# Patient Record
Sex: Male | Born: 1935 | Race: White | Hispanic: No | Marital: Married | Smoking: Former smoker
Health system: Southern US, Community
[De-identification: ages and names within clinical notes are randomized; demographics above are authoritative.]

## PROBLEM LIST (undated history)

## (undated) DIAGNOSIS — M5020 Other cervical disc displacement, unspecified cervical region: Secondary | ICD-10-CM

## (undated) DIAGNOSIS — J302 Other seasonal allergic rhinitis: Secondary | ICD-10-CM

## (undated) DIAGNOSIS — E119 Type 2 diabetes mellitus without complications: Secondary | ICD-10-CM

## (undated) DIAGNOSIS — R160 Hepatomegaly, not elsewhere classified: Secondary | ICD-10-CM

## (undated) DIAGNOSIS — I10 Essential (primary) hypertension: Secondary | ICD-10-CM

## (undated) DIAGNOSIS — N200 Calculus of kidney: Secondary | ICD-10-CM

## (undated) DIAGNOSIS — M1990 Unspecified osteoarthritis, unspecified site: Secondary | ICD-10-CM

## (undated) DIAGNOSIS — R52 Pain, unspecified: Secondary | ICD-10-CM

## (undated) DIAGNOSIS — C801 Malignant (primary) neoplasm, unspecified: Secondary | ICD-10-CM

## (undated) DIAGNOSIS — Z9889 Other specified postprocedural states: Secondary | ICD-10-CM

## (undated) DIAGNOSIS — C61 Malignant neoplasm of prostate: Principal | ICD-10-CM

## (undated) HISTORY — PX: OTHER SURGICAL HISTORY: SHX169

## (undated) HISTORY — DX: Essential (primary) hypertension: I10

## (undated) HISTORY — PX: APPENDECTOMY: SHX54

## (undated) HISTORY — DX: Calculus of kidney: N20.0

## (undated) HISTORY — DX: Type 2 diabetes mellitus without complications: E11.9

## (undated) HISTORY — DX: Malignant neoplasm of prostate: C61

## (undated) HISTORY — DX: Other cervical disc displacement, unspecified cervical region: M50.20

## (undated) HISTORY — DX: Other specified postprocedural states: Z98.890

## (undated) HISTORY — DX: Hepatomegaly, not elsewhere classified: R16.0

---

## 1997-12-24 ENCOUNTER — Ambulatory Visit (HOSPITAL_COMMUNITY): Admission: RE | Admit: 1997-12-24 | Discharge: 1997-12-24 | Payer: Self-pay | Admitting: Internal Medicine

## 1998-03-27 ENCOUNTER — Other Ambulatory Visit: Admission: RE | Admit: 1998-03-27 | Discharge: 1998-03-27 | Payer: Self-pay | Admitting: Urology

## 1999-01-12 ENCOUNTER — Other Ambulatory Visit: Admission: RE | Admit: 1999-01-12 | Discharge: 1999-01-12 | Payer: Self-pay | Admitting: Urology

## 1999-02-04 ENCOUNTER — Ambulatory Visit (HOSPITAL_COMMUNITY): Admission: RE | Admit: 1999-02-04 | Discharge: 1999-02-04 | Payer: Self-pay | Admitting: Radiation Oncology

## 1999-03-22 ENCOUNTER — Ambulatory Visit (HOSPITAL_COMMUNITY): Admission: RE | Admit: 1999-03-22 | Discharge: 1999-03-22 | Payer: Self-pay | Admitting: Radiation Oncology

## 1999-04-08 ENCOUNTER — Encounter: Admission: RE | Admit: 1999-04-08 | Discharge: 1999-07-07 | Payer: Self-pay | Admitting: Radiation Oncology

## 1999-04-23 ENCOUNTER — Ambulatory Visit (HOSPITAL_BASED_OUTPATIENT_CLINIC_OR_DEPARTMENT_OTHER): Admission: RE | Admit: 1999-04-23 | Discharge: 1999-04-23 | Payer: Self-pay | Admitting: Urology

## 1999-04-23 ENCOUNTER — Encounter: Payer: Self-pay | Admitting: Urology

## 1999-05-17 ENCOUNTER — Encounter: Admission: RE | Admit: 1999-05-17 | Discharge: 1999-08-15 | Payer: Self-pay | Admitting: Radiation Oncology

## 1999-11-23 ENCOUNTER — Encounter: Payer: Self-pay | Admitting: Internal Medicine

## 1999-11-23 ENCOUNTER — Ambulatory Visit (HOSPITAL_COMMUNITY): Admission: RE | Admit: 1999-11-23 | Discharge: 1999-11-23 | Payer: Self-pay | Admitting: Internal Medicine

## 2000-06-09 ENCOUNTER — Encounter: Payer: Self-pay | Admitting: Internal Medicine

## 2000-06-09 ENCOUNTER — Ambulatory Visit (HOSPITAL_COMMUNITY): Admission: RE | Admit: 2000-06-09 | Discharge: 2000-06-09 | Payer: Self-pay | Admitting: Internal Medicine

## 2000-12-25 ENCOUNTER — Ambulatory Visit (HOSPITAL_COMMUNITY): Admission: RE | Admit: 2000-12-25 | Discharge: 2000-12-25 | Payer: Self-pay | Admitting: Ophthalmology

## 2001-05-31 ENCOUNTER — Ambulatory Visit (HOSPITAL_COMMUNITY): Admission: RE | Admit: 2001-05-31 | Discharge: 2001-05-31 | Payer: Self-pay | Admitting: Internal Medicine

## 2001-05-31 ENCOUNTER — Encounter: Payer: Self-pay | Admitting: Internal Medicine

## 2001-06-20 HISTORY — PX: COLON SURGERY: SHX602

## 2001-11-23 ENCOUNTER — Inpatient Hospital Stay (HOSPITAL_COMMUNITY): Admission: EM | Admit: 2001-11-23 | Discharge: 2001-11-23 | Payer: Self-pay | Admitting: Emergency Medicine

## 2001-12-31 ENCOUNTER — Encounter (INDEPENDENT_AMBULATORY_CARE_PROVIDER_SITE_OTHER): Payer: Self-pay | Admitting: Specialist

## 2001-12-31 ENCOUNTER — Encounter: Payer: Self-pay | Admitting: Gastroenterology

## 2001-12-31 ENCOUNTER — Ambulatory Visit (HOSPITAL_COMMUNITY): Admission: RE | Admit: 2001-12-31 | Discharge: 2001-12-31 | Payer: Self-pay | Admitting: Gastroenterology

## 2002-01-22 ENCOUNTER — Encounter: Payer: Self-pay | Admitting: General Surgery

## 2002-01-29 ENCOUNTER — Inpatient Hospital Stay (HOSPITAL_COMMUNITY): Admission: RE | Admit: 2002-01-29 | Discharge: 2002-02-02 | Payer: Self-pay | Admitting: General Surgery

## 2002-01-29 ENCOUNTER — Encounter (INDEPENDENT_AMBULATORY_CARE_PROVIDER_SITE_OTHER): Payer: Self-pay | Admitting: Specialist

## 2002-03-20 ENCOUNTER — Inpatient Hospital Stay (HOSPITAL_COMMUNITY): Admission: EM | Admit: 2002-03-20 | Discharge: 2002-03-26 | Payer: Self-pay | Admitting: Emergency Medicine

## 2002-03-20 ENCOUNTER — Encounter: Payer: Self-pay | Admitting: *Deleted

## 2002-03-23 ENCOUNTER — Encounter: Payer: Self-pay | Admitting: Hematology & Oncology

## 2002-06-25 ENCOUNTER — Encounter: Payer: Self-pay | Admitting: *Deleted

## 2002-06-25 ENCOUNTER — Ambulatory Visit (HOSPITAL_COMMUNITY): Admission: RE | Admit: 2002-06-25 | Discharge: 2002-06-25 | Payer: Self-pay | Admitting: *Deleted

## 2002-12-18 ENCOUNTER — Ambulatory Visit (HOSPITAL_COMMUNITY): Admission: RE | Admit: 2002-12-18 | Discharge: 2002-12-18 | Payer: Self-pay | Admitting: Gastroenterology

## 2002-12-18 ENCOUNTER — Encounter (INDEPENDENT_AMBULATORY_CARE_PROVIDER_SITE_OTHER): Payer: Self-pay | Admitting: Specialist

## 2003-01-08 ENCOUNTER — Ambulatory Visit (HOSPITAL_COMMUNITY): Admission: RE | Admit: 2003-01-08 | Discharge: 2003-01-08 | Payer: Self-pay | Admitting: Oncology

## 2003-01-08 ENCOUNTER — Encounter: Payer: Self-pay | Admitting: Oncology

## 2003-02-21 ENCOUNTER — Encounter: Payer: Self-pay | Admitting: Internal Medicine

## 2003-02-21 ENCOUNTER — Encounter: Admission: RE | Admit: 2003-02-21 | Discharge: 2003-02-21 | Payer: Self-pay | Admitting: Internal Medicine

## 2003-02-23 ENCOUNTER — Encounter: Admission: RE | Admit: 2003-02-23 | Discharge: 2003-02-23 | Payer: Self-pay | Admitting: Internal Medicine

## 2003-02-23 ENCOUNTER — Encounter: Payer: Self-pay | Admitting: Internal Medicine

## 2004-07-13 ENCOUNTER — Ambulatory Visit: Payer: Self-pay | Admitting: Oncology

## 2004-07-29 ENCOUNTER — Ambulatory Visit (HOSPITAL_COMMUNITY): Admission: RE | Admit: 2004-07-29 | Discharge: 2004-07-29 | Payer: Self-pay | Admitting: Oncology

## 2005-01-10 ENCOUNTER — Ambulatory Visit: Payer: Self-pay | Admitting: Oncology

## 2005-01-24 ENCOUNTER — Ambulatory Visit (HOSPITAL_COMMUNITY): Admission: RE | Admit: 2005-01-24 | Discharge: 2005-01-24 | Payer: Self-pay | Admitting: Oncology

## 2005-07-11 ENCOUNTER — Ambulatory Visit: Payer: Self-pay | Admitting: Oncology

## 2006-01-03 ENCOUNTER — Ambulatory Visit: Payer: Self-pay | Admitting: Oncology

## 2006-01-06 LAB — CBC WITH DIFFERENTIAL/PLATELET
Eosinophils Absolute: 0.1 10*3/uL (ref 0.0–0.5)
MONO#: 0.6 10*3/uL (ref 0.1–0.9)
MONO%: 8.8 % (ref 0.0–13.0)
NEUT#: 3.5 10*3/uL (ref 1.5–6.5)
RBC: 4.31 10*6/uL (ref 4.20–5.71)
RDW: 12.8 % (ref 11.2–14.6)
WBC: 7.3 10*3/uL (ref 4.0–10.0)
lymph#: 3 10*3/uL (ref 0.9–3.3)

## 2006-01-06 LAB — COMPREHENSIVE METABOLIC PANEL
Albumin: 4.6 g/dL (ref 3.5–5.2)
Alkaline Phosphatase: 49 U/L (ref 39–117)
CO2: 23 mEq/L (ref 19–32)
Chloride: 105 mEq/L (ref 96–112)
Glucose, Bld: 105 mg/dL — ABNORMAL HIGH (ref 70–99)
Potassium: 4.1 mEq/L (ref 3.5–5.3)
Sodium: 142 mEq/L (ref 135–145)
Total Protein: 8.2 g/dL (ref 6.0–8.3)

## 2006-01-06 LAB — LACTATE DEHYDROGENASE: LDH: 162 U/L (ref 94–250)

## 2006-01-12 ENCOUNTER — Ambulatory Visit (HOSPITAL_COMMUNITY): Admission: RE | Admit: 2006-01-12 | Discharge: 2006-01-12 | Payer: Self-pay | Admitting: Oncology

## 2006-02-01 ENCOUNTER — Emergency Department (HOSPITAL_COMMUNITY): Admission: EM | Admit: 2006-02-01 | Discharge: 2006-02-01 | Payer: Self-pay | Admitting: Emergency Medicine

## 2006-07-12 ENCOUNTER — Ambulatory Visit: Payer: Self-pay | Admitting: Oncology

## 2006-07-17 LAB — PSA: PSA: 0.45 ng/mL (ref 0.10–4.00)

## 2006-07-17 LAB — COMPREHENSIVE METABOLIC PANEL
Alkaline Phosphatase: 50 U/L (ref 39–117)
BUN: 12 mg/dL (ref 6–23)
Creatinine, Ser: 0.85 mg/dL (ref 0.40–1.50)
Glucose, Bld: 184 mg/dL — ABNORMAL HIGH (ref 70–99)
Total Bilirubin: 0.5 mg/dL (ref 0.3–1.2)

## 2006-07-17 LAB — CBC WITH DIFFERENTIAL/PLATELET
Basophils Absolute: 0 10*3/uL (ref 0.0–0.1)
EOS%: 1.8 % (ref 0.0–7.0)
HGB: 13.7 g/dL (ref 13.0–17.1)
MCH: 32.2 pg (ref 28.0–33.4)
MCV: 92 fL (ref 81.6–98.0)
MONO%: 7.3 % (ref 0.0–13.0)
NEUT%: 51.8 % (ref 40.0–75.0)
RDW: 12.4 % (ref 11.2–14.6)

## 2006-07-19 ENCOUNTER — Ambulatory Visit (HOSPITAL_COMMUNITY): Admission: RE | Admit: 2006-07-19 | Discharge: 2006-07-19 | Payer: Self-pay | Admitting: Oncology

## 2007-01-11 ENCOUNTER — Ambulatory Visit: Payer: Self-pay | Admitting: Oncology

## 2007-01-15 LAB — CBC WITH DIFFERENTIAL/PLATELET
BASO%: 0.6 % (ref 0.0–2.0)
Basophils Absolute: 0 10*3/uL (ref 0.0–0.1)
HCT: 39.4 % (ref 38.7–49.9)
HGB: 13.8 g/dL (ref 13.0–17.1)
MONO#: 0.6 10*3/uL (ref 0.1–0.9)
NEUT%: 49.3 % (ref 40.0–75.0)
WBC: 7 10*3/uL (ref 4.0–10.0)
lymph#: 2.7 10*3/uL (ref 0.9–3.3)

## 2007-01-15 LAB — COMPREHENSIVE METABOLIC PANEL
ALT: 28 U/L (ref 0–53)
Albumin: 4.3 g/dL (ref 3.5–5.2)
BUN: 12 mg/dL (ref 6–23)
CO2: 24 mEq/L (ref 19–32)
Calcium: 9.3 mg/dL (ref 8.4–10.5)
Chloride: 105 mEq/L (ref 96–112)
Creatinine, Ser: 0.95 mg/dL (ref 0.40–1.50)
Potassium: 4.4 mEq/L (ref 3.5–5.3)

## 2007-01-15 LAB — LACTATE DEHYDROGENASE: LDH: 136 U/L (ref 94–250)

## 2007-01-15 LAB — CEA: CEA: <0.5 ng/mL (ref 0.0–5.0)

## 2007-01-17 ENCOUNTER — Ambulatory Visit (HOSPITAL_COMMUNITY): Admission: RE | Admit: 2007-01-17 | Discharge: 2007-01-17 | Payer: Self-pay | Admitting: Oncology

## 2007-07-18 IMAGING — CT CT ABDOMEN W/ CM
1 of 4 series · 14 of 32 positions shown, 19 images · IV contrast (omnipaque)
Comparison: 07/29/04.

CLINICAL DATA: Transverse colon cancer.
ABDOMEN CT WITH CONTRAST:
TECHNIQUE: Multidetector CT imaging of the abdomen was performed following the standard protocol during bolus administration of intravenous contrast.
Contrast:  125 cc Omnipaque 300
TECHNIQUE: Multidetector CT imaging of the pelvis was performed following the standard protocol during bolus administration of intravenous contrast.
No pelvic lymphadenopathy. Radiation seeds are seen within the prostate gland.  No free pelvic fluid.  The visualized pelvic bowel loops are negative.  Negative for inguinal lymphadenopathy.  Review of bone windows demonstrates no obvious lytic or sclerotic lesions.

[Series 2: abd_pel 5.0 b40f st · axial · 0.85mm/px · z∈[-509,-109]mm · 14 of 92 slices shown, 19 images]
[im 6/92  soft-tissue]
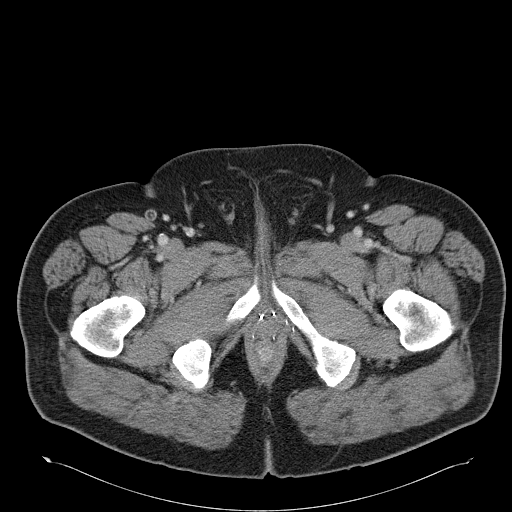
[im 6/92  bone]
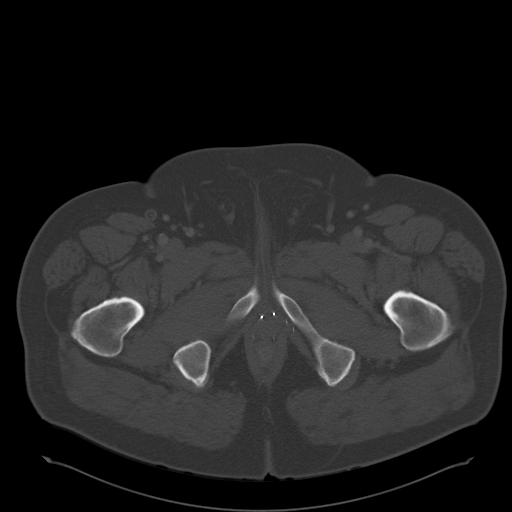
[im 12/92  soft-tissue]
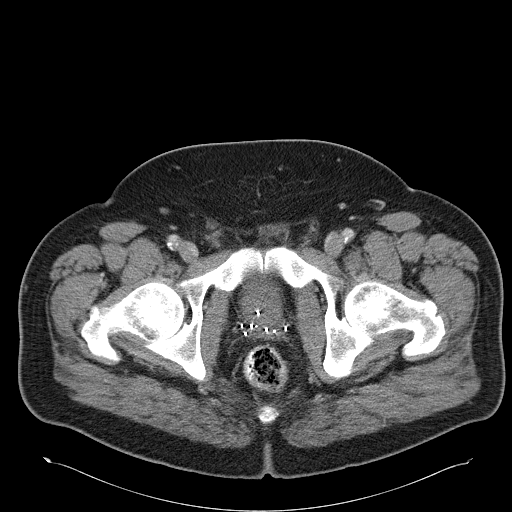
[im 18/92  soft-tissue]
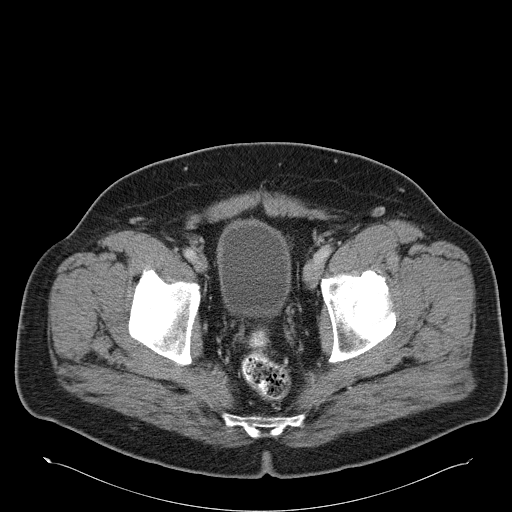
[im 29/92  soft-tissue]
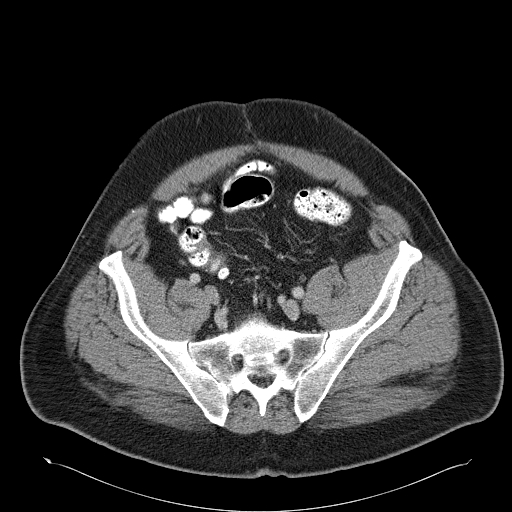
[im 35/92  soft-tissue]
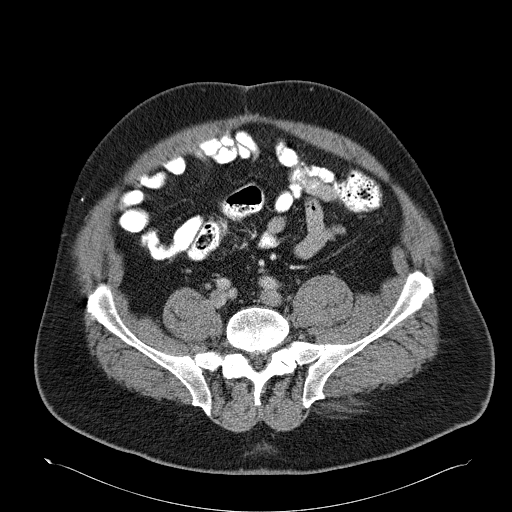
[im 40/92  soft-tissue]
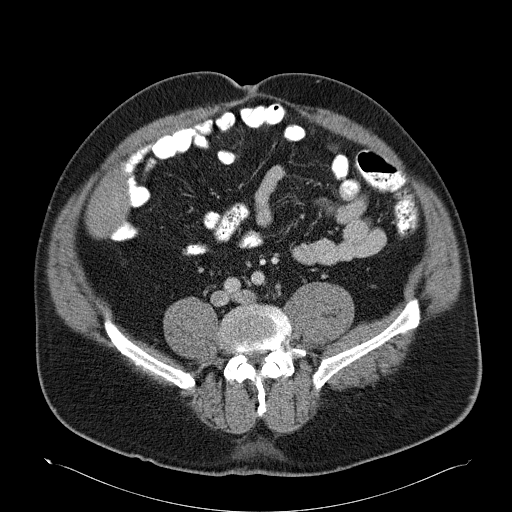
[im 46/92  soft-tissue]
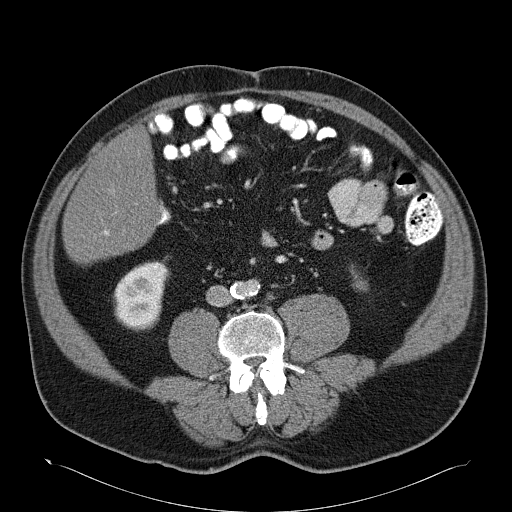
[im 52/92  soft-tissue]
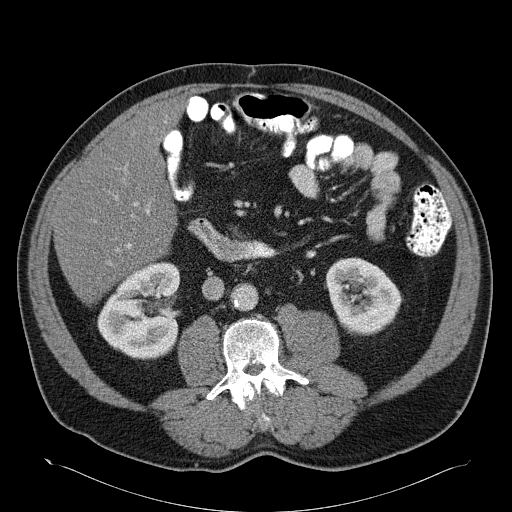
[im 57/92  soft-tissue]
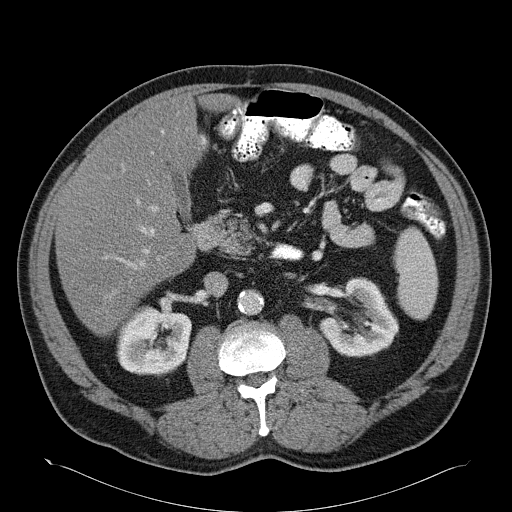
[im 57/92  bone]
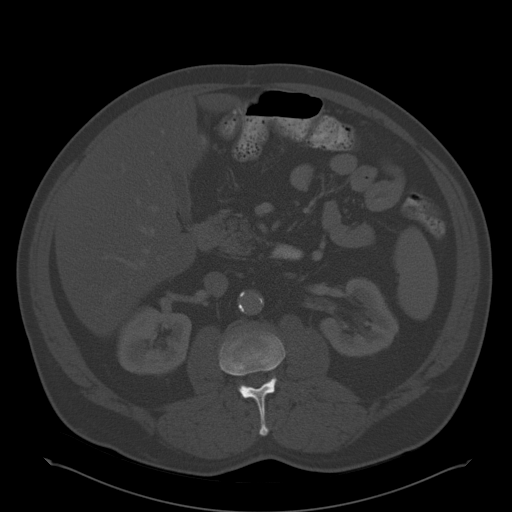
[im 63/92  soft-tissue]
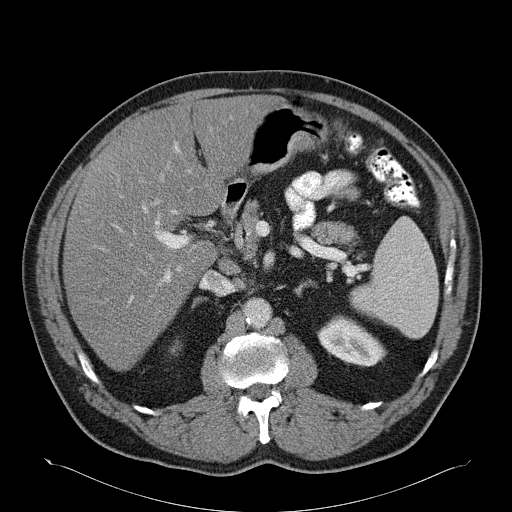
[im 69/92  lung]
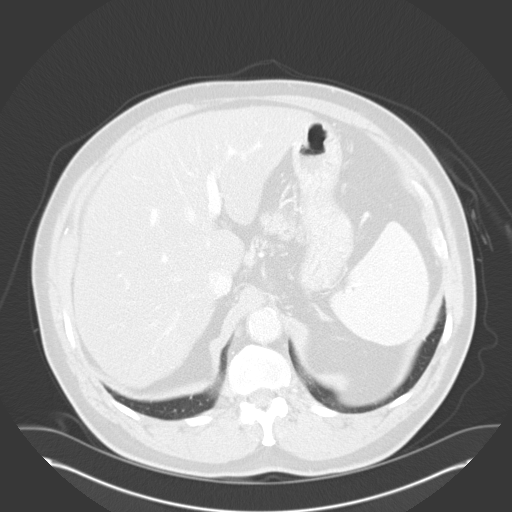
[im 74/92  soft-tissue]
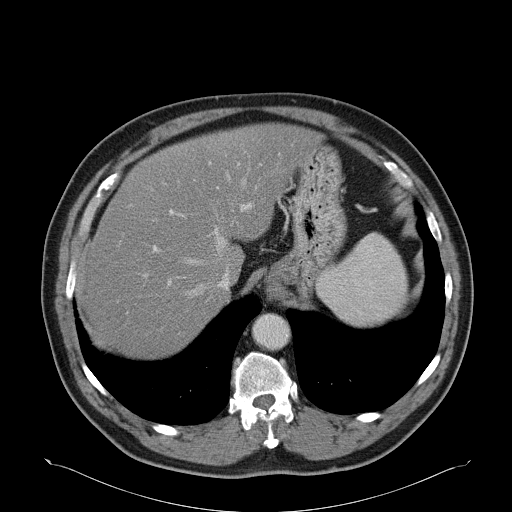
[im 74/92  lung]
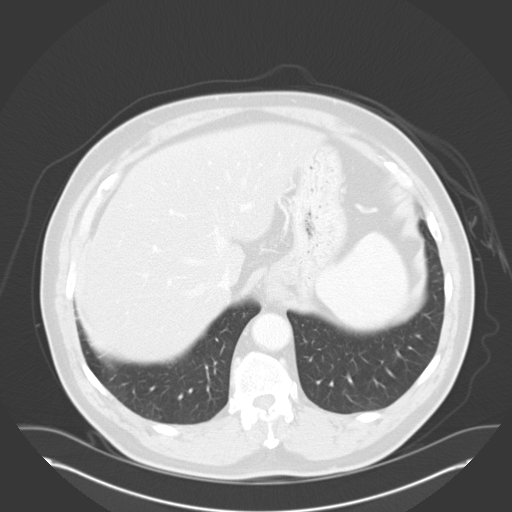
[im 80/92  soft-tissue]
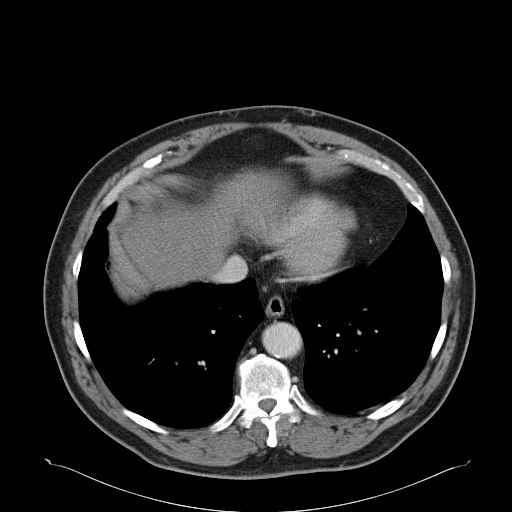
[im 80/92  lung]
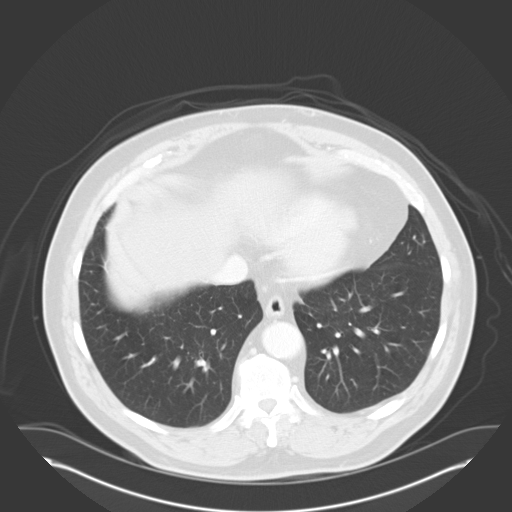
[im 86/92  soft-tissue]
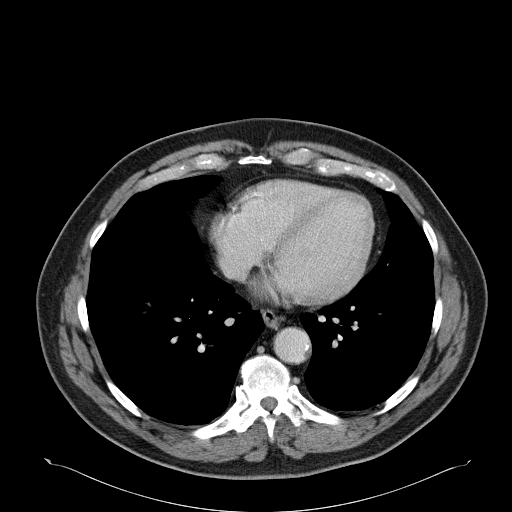
[im 86/92  lung]
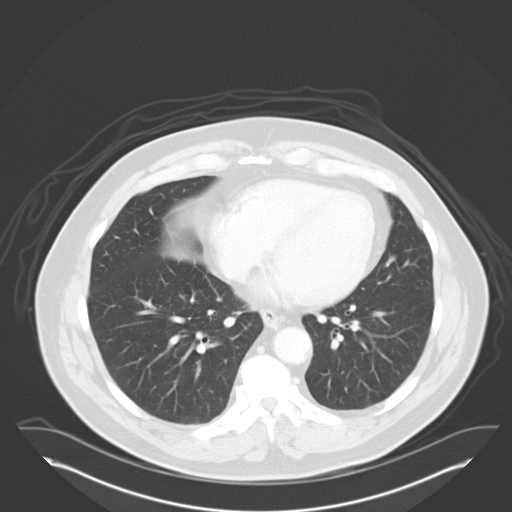

[14 of 32 positions shown; findings below may reference images not displayed]

FINDINGS: Subpleural nodule right lower lobe, image 11, is stable in the interval.  
There are no pleural effusions.
Fatty infiltration of the liver is again noted.  No focal liver lesions are identified.  
Pancreas is negative.
Right kidney negative.  Left kidney negative.
No pathologically enlarged retroperitoneal or pelvic lymphadenopathy.
Stable small porta hepatis and mesenteric lymph nodes.
IMPRESSION: 1.  Fatty infiltration of the liver.
2.  Stable small mesenteric and porta hepatis lymph nodes which are not pathologically enlarged.
PELVIS CT WITH CONTRAST:
IMPRESSION: No evidence for residual or recurrent tumor within the pelvis.

## 2008-01-11 ENCOUNTER — Ambulatory Visit: Payer: Self-pay | Admitting: Oncology

## 2008-01-15 LAB — CEA: CEA: <0.5 ng/mL (ref 0.0–5.0)

## 2008-01-15 LAB — CBC WITH DIFFERENTIAL/PLATELET
Eosinophils Absolute: 0.1 10*3/uL (ref 0.0–0.5)
HCT: 40.9 % (ref 38.7–49.9)
LYMPH%: 41 % (ref 14.0–48.0)
MCHC: 34.7 g/dL (ref 32.0–35.9)
MCV: 92.6 fL (ref 81.6–98.0)
MONO#: 0.5 10*3/uL (ref 0.1–0.9)
MONO%: 6.8 % (ref 0.0–13.0)
NEUT#: 4 10*3/uL (ref 1.5–6.5)
NEUT%: 49.9 % (ref 40.0–75.0)
Platelets: 195 10*3/uL (ref 145–400)
WBC: 8.1 10*3/uL (ref 4.0–10.0)

## 2008-01-15 LAB — PSA: PSA: 1.01 ng/mL (ref 0.10–4.00)

## 2008-01-15 LAB — COMPREHENSIVE METABOLIC PANEL
Alkaline Phosphatase: 42 U/L (ref 39–117)
CO2: 22 mEq/L (ref 19–32)
Creatinine, Ser: 0.92 mg/dL (ref 0.40–1.50)
Glucose, Bld: 160 mg/dL — ABNORMAL HIGH (ref 70–99)
Total Bilirubin: 0.7 mg/dL (ref 0.3–1.2)

## 2008-01-15 LAB — LACTATE DEHYDROGENASE: LDH: 143 U/L (ref 94–250)

## 2008-01-17 ENCOUNTER — Ambulatory Visit (HOSPITAL_COMMUNITY): Admission: RE | Admit: 2008-01-17 | Discharge: 2008-01-17 | Payer: Self-pay | Admitting: Oncology

## 2008-06-20 HISTORY — PX: ANTERIOR CERVICAL DECOMP/DISCECTOMY FUSION: SHX1161

## 2008-07-11 ENCOUNTER — Ambulatory Visit: Payer: Self-pay | Admitting: Oncology

## 2008-07-15 ENCOUNTER — Ambulatory Visit (HOSPITAL_COMMUNITY): Admission: RE | Admit: 2008-07-15 | Discharge: 2008-07-15 | Payer: Self-pay | Admitting: Oncology

## 2008-07-25 IMAGING — CR DG WRIST COMPLETE 3+V*L*
4 series · 4 of 4 positions shown · non-contrast
Comparison: none

CLINICAL DATA: 70-year-old male, laceration to forearm.
 LEFT WRIST ? 4 VIEW ? 02/01/06:

[x wrist pa left]
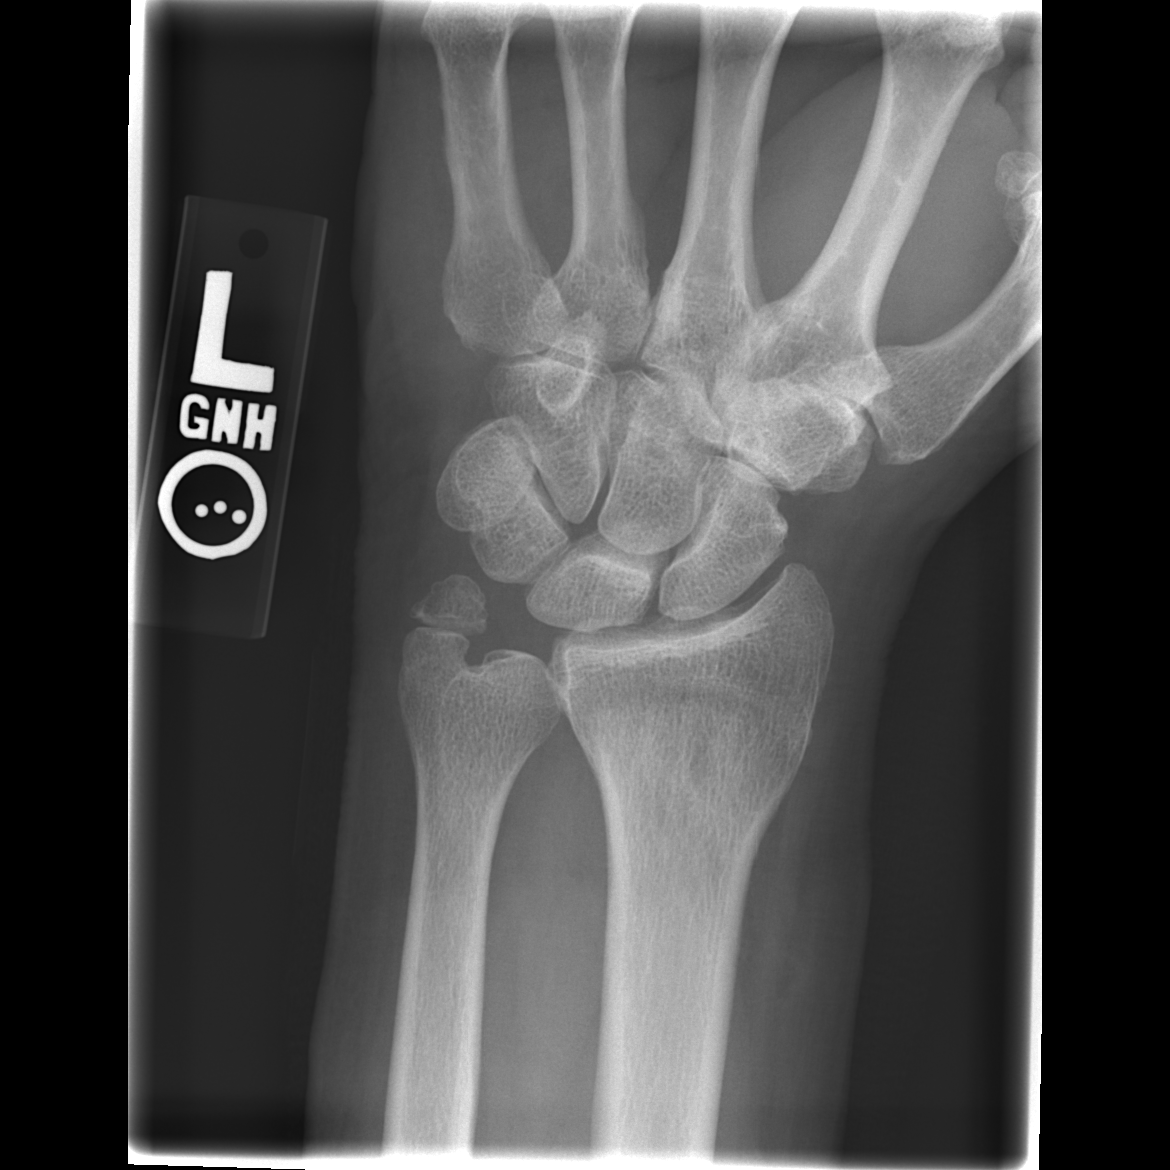

[x wrist obl left]
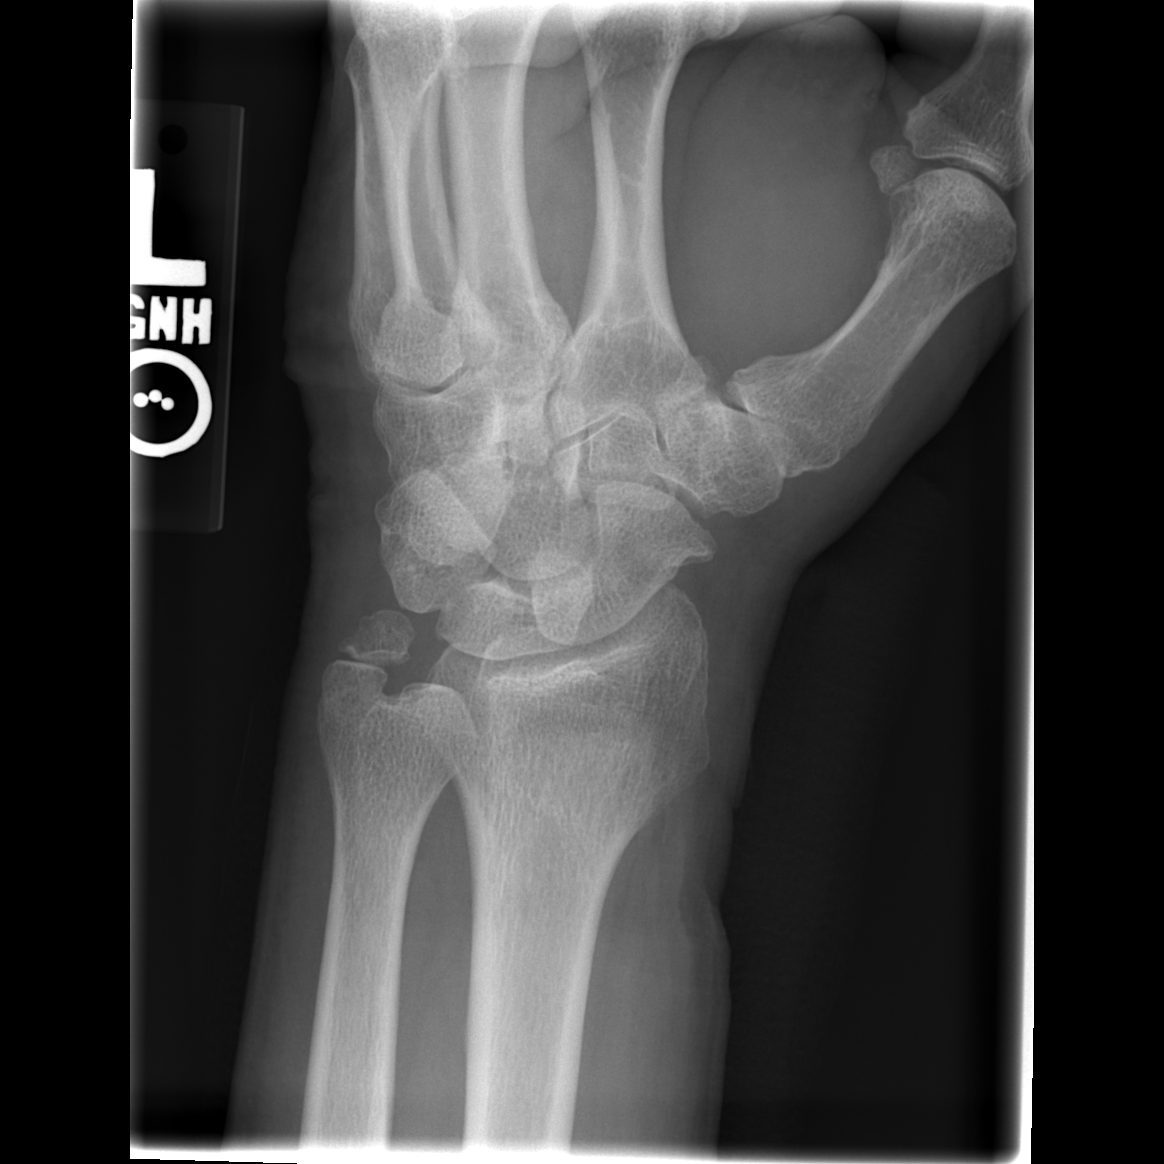

[x wrist lat left]
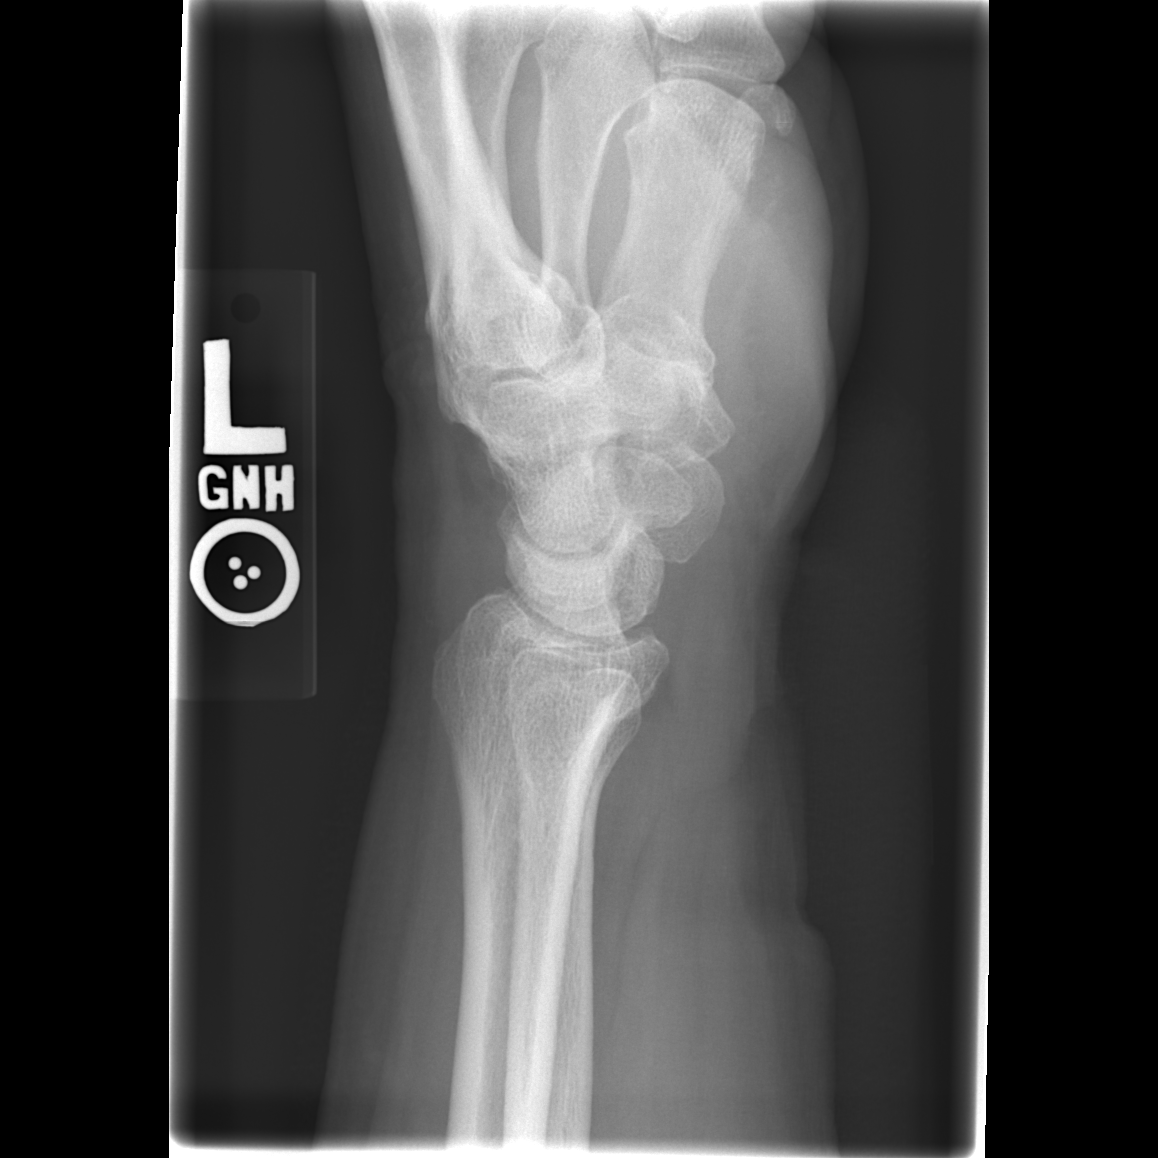

[x navicular]
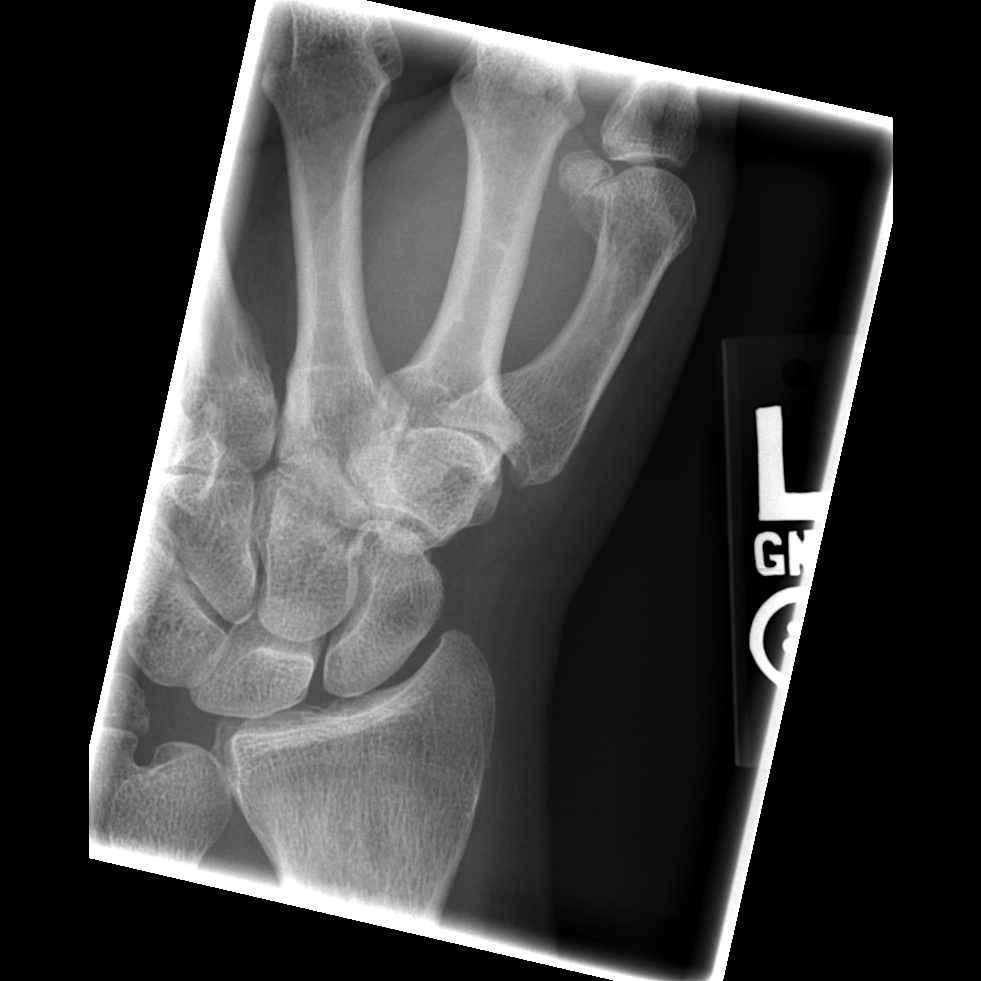

[4 of 4 positions shown; findings below may reference images not displayed]

FINDINGS: There is a well-corticated defect of the ulnar styloid compatible with remote fracture.  Bandages about the wrist limit evaluation of the soft tissues, although there does appear to be a laceration along the ventral surface.  Degenerative change is noted at the first carpometacarpal joint.
IMPRESSION: 1.  No acute fracture or dislocation in the wrist.  
 2.  Soft tissue injury of the ventral wrist as described.  Evaluation is limited by bandages. 
 3.  Remote fracture ulnar styloid.
 4.  Mild osteoarthritic changes of the first carpometacarpal joint.

## 2008-09-28 ENCOUNTER — Emergency Department (HOSPITAL_COMMUNITY): Admission: EM | Admit: 2008-09-28 | Discharge: 2008-09-28 | Payer: Self-pay | Admitting: Emergency Medicine

## 2009-01-08 ENCOUNTER — Ambulatory Visit: Payer: Self-pay | Admitting: Oncology

## 2009-01-12 LAB — COMPREHENSIVE METABOLIC PANEL
ALT: 29 U/L (ref 0–53)
AST: 36 U/L (ref 0–37)
CO2: 22 mEq/L (ref 19–32)
Calcium: 9.4 mg/dL (ref 8.4–10.5)
Chloride: 103 mEq/L (ref 96–112)
Creatinine, Ser: 0.84 mg/dL (ref 0.40–1.50)
Potassium: 4.3 mEq/L (ref 3.5–5.3)
Sodium: 137 mEq/L (ref 135–145)
Total Protein: 8 g/dL (ref 6.0–8.3)

## 2009-01-12 LAB — CBC WITH DIFFERENTIAL/PLATELET
BASO%: 0.6 % (ref 0.0–2.0)
EOS%: 1.9 % (ref 0.0–7.0)
MCH: 31.3 pg (ref 27.2–33.4)
MCHC: 34.8 g/dL (ref 32.0–36.0)
MONO#: 0.5 10*3/uL (ref 0.1–0.9)
NEUT%: 52.5 % (ref 39.0–75.0)
RBC: 4.31 10*6/uL (ref 4.20–5.82)
RDW: 12.4 % (ref 11.0–14.6)
WBC: 6.8 10*3/uL (ref 4.0–10.3)
lymph#: 2.6 10*3/uL (ref 0.9–3.3)

## 2009-01-12 LAB — LACTATE DEHYDROGENASE: LDH: 144 U/L (ref 94–250)

## 2009-01-22 ENCOUNTER — Ambulatory Visit (HOSPITAL_COMMUNITY): Admission: RE | Admit: 2009-01-22 | Discharge: 2009-01-22 | Payer: Self-pay | Admitting: Oncology

## 2009-01-26 ENCOUNTER — Encounter: Admission: RE | Admit: 2009-01-26 | Discharge: 2009-01-26 | Payer: Self-pay | Admitting: Oncology

## 2009-04-03 ENCOUNTER — Ambulatory Visit (HOSPITAL_COMMUNITY): Admission: RE | Admit: 2009-04-03 | Discharge: 2009-04-04 | Payer: Self-pay | Admitting: Neurosurgery

## 2009-07-24 ENCOUNTER — Ambulatory Visit: Payer: Self-pay | Admitting: Oncology

## 2009-07-28 LAB — PSA: PSA: 1.43 ng/mL (ref 0.10–4.00)

## 2010-01-20 ENCOUNTER — Ambulatory Visit: Payer: Self-pay | Admitting: Oncology

## 2010-01-22 LAB — CBC WITH DIFFERENTIAL/PLATELET
BASO%: 0.5 % (ref 0.0–2.0)
EOS%: 1.7 % (ref 0.0–7.0)
HCT: 37.5 % — ABNORMAL LOW (ref 38.4–49.9)
MCH: 32.3 pg (ref 27.2–33.4)
MCHC: 34.7 g/dL (ref 32.0–36.0)
MONO#: 0.5 10*3/uL (ref 0.1–0.9)
NEUT%: 42.5 % (ref 39.0–75.0)
RBC: 4.03 10*6/uL — ABNORMAL LOW (ref 4.20–5.82)
WBC: 7 10*3/uL (ref 4.0–10.3)
lymph#: 3.3 10*3/uL (ref 0.9–3.3)

## 2010-01-22 LAB — COMPREHENSIVE METABOLIC PANEL
ALT: 22 U/L (ref 0–53)
AST: 26 U/L (ref 0–37)
CO2: 23 mEq/L (ref 19–32)
Calcium: 9.3 mg/dL (ref 8.4–10.5)
Chloride: 107 mEq/L (ref 96–112)
Creatinine, Ser: 0.83 mg/dL (ref 0.40–1.50)
Sodium: 142 mEq/L (ref 135–145)
Total Bilirubin: 0.4 mg/dL (ref 0.3–1.2)
Total Protein: 7.9 g/dL (ref 6.0–8.3)

## 2010-07-03 ENCOUNTER — Encounter
Admission: RE | Admit: 2010-07-03 | Discharge: 2010-07-03 | Payer: Self-pay | Source: Home / Self Care | Attending: Neurosurgery | Admitting: Neurosurgery

## 2010-09-23 LAB — BASIC METABOLIC PANEL
CO2: 27 mEq/L (ref 19–32)
Chloride: 104 mEq/L (ref 96–112)
GFR calc Af Amer: >60 mL/min (ref >60)
Potassium: 4.2 mEq/L (ref 3.5–5.1)

## 2010-09-23 LAB — CBC
HCT: 37.6 % — ABNORMAL LOW (ref 39.0–52.0)
MCHC: 35 g/dL (ref 30.0–36.0)
MCV: 93.8 fL (ref 78.0–100.0)
RBC: 4.01 MIL/uL — ABNORMAL LOW (ref 4.22–5.81)
WBC: 6.8 10*3/uL (ref 4.0–10.5)

## 2010-09-24 LAB — CBC
HCT: 41.6 % (ref 39.0–52.0)
MCHC: 34.3 g/dL (ref 30.0–36.0)
MCV: 94.6 fL (ref 78.0–100.0)
Platelets: 254 10*3/uL (ref 150–400)
RDW: 12.3 % (ref 11.5–15.5)

## 2010-11-05 NOTE — Discharge Summary (Signed)
   NAME:  MONTFORD, BARG                       ACCOUNT NO.:  [id number]   MEDICAL RECORD NO.:  [id number]                   PATIENT TYPE:  AMB   LOCATION:  ENDO                                 FACILITY:  [facility]   PHYSICIAN:  Adolph Pollack, M.D.            DATE OF BIRTH:  05/01/1936   DATE OF ADMISSION:  01/29/2002  DATE OF DISCHARGE:  02/02/2002                                 DISCHARGE SUMMARY   PRINCIPAL DISCHARGE DIAGNOSES:  Stage III colon cancer.   SECONDARY DIAGNOSES:  None.   PROCEDURE:  Extended right colectomy.   HISTORY OF PRESENT ILLNESS:  The patient is a 75 year old male with rectal  bleeding who underwent a colonoscopy.  Is found to have a malignant lesion  what appeared to be the proximal transverse colon.  He did not have any  known hepatic lesions by CT scan and now was admitted for elective partial  colectomy.   HOSPITAL COURSE:  He underwent the above operation and had a very uneventful  postoperative course.  Pathology did come back moderately differentiated  adenocarcinoma.  It was a T3 N1 M0 lesion making it stage III.  He was  started on a liquid diet on the second postoperative day.  This advanced  well.  Was having bowel movements, passing gas, and by his fourth  postoperative day was able to be discharged.   DISPOSITION:  Discharged to home on February 02, 2002 in satisfactory  condition.  He will be seen in four or five days to have his staples  removed.  He said he did not have much pain so he was told to take a  nonsteroidal for pain as needed.  He will call if he has any problems.  He  was discharged in satisfactory condition.                                               Adolph Pollack, M.D.    Kari Baars  D:  02/15/2002  T:  02/15/2002  Job:  [id number]   cc:   Juluis Mire, M.D.   Charna Elizabeth, M.D.  [address]  [place]  [place] [address]  Fax: [phone]

## 2010-11-05 NOTE — Op Note (Signed)
Jeffery Castillo, Jeffery Castillo                      ACCOUNT NO.:  [id number]   MEDICAL RECORD NO.:  [id number]                   PATIENT TYPE:  INP   LOCATION:  0381                                 FACILITY:  [facility]   PHYSICIAN:  Adolph Pollack, M.D.            DATE OF BIRTH:  1935/07/12   DATE OF PROCEDURE:  DATE OF DISCHARGE:                                 OPERATIVE REPORT   PREOPERATIVE DIAGNOSIS:  Proximal transverse colon cancer.   POSTOPERATIVE DIAGNOSIS:  Proximal transverse colon cancer.   PROCEDURE:  Extended right colectomy.   SURGEON:  Adolph Pollack, M.D.   ASSISTANT:  Lorne Skeens. Hoxworth, M.D.   ANESTHESIA:  General.   INDICATIONS FOR PROCEDURE:  Mr. Chacko is a 75 year old male with rectal  bleeding who had a colonoscopy by Dr. Loreta Ave and was found to have a mass in  the transverse colon that was biopsied and positive for adenocarcinoma. A CT  scan demonstrated a 6 cm mass in the proximal transverse colon. So there is  slightly enlarged lymph node noted anterior at the vena cava. No obvious  liver metastasis. He now presents for elective partial colectomy. The  procedure and the risks were explained to him preoperatively.   TECHNIQUE:  He was brought to the operating room, placed supine on the  operating table and a general anesthetic was administered. The abdomen was  sterilely prepped and draped. A midline incision was made through the skin,  subcutaneous tissue, fascia and peritoneum. The abdomen was then explored.  The liver was smooth without lesions. The gallbladder did not demonstrate  gallstones although it was slightly distended. No gastric lesions. No small  bowel lesions. No direct metastasis in the pelvis. I could palpate the tumor  in the proximal transverse colon just distal to the hepatic flexure.   I began by mobilizing the right colon incising the lateral peritoneal  attachments. I identified the ureter and kept the plane of dissection  above  this. I then mobilized the hepatic flexure. I entered the lesser sac and  divided the short vessels. There was some infiltrative type process or  inflammatory type process in the mesentery near the lesser sac. I divided  the omentum beyond the area of the tumor and then mobilized the omentum off  the distal part of the transverse colon up to the distal 2/3 of it. At this  point, I divided the colon. I then divided the ileum just proximal to the  ileocecal valve. I then divided the mesenteric vessels and ligated them and  handed the specimen off the field which included the distal ileum, right  colon and proximal two-thirds of the transverse colon along with mesentery  and lymph nodes.   Next a side to side stapled anastomosis was performed between the ileum and  distal transverse colon. The anastomosis was patent and viable under no  tension. The remaining enterotomy was closed with a linear noncutting  stapler and the mesh was oversewn with interrupted 3-0 Vicryl sutures.   The gloves were changed at this time. I irrigated out the abdominal cavity,  lifted all the dissection areas and no bleeding was noted. The anastomosis  was patent, viable and under no tension when checked once again.   At this point, needle, sponge and instrument count was done and reported to  be correct. The fascia was then closed with a #1 running PDS suture. The  subcutaneous tissue was irrigated and closed with staples.   I had attempted before closure to palpate the enlarged lymph node in the  area of the vena cava or next to the liver but I could not appreciate any  adenopathy. He tolerated this procedure well without any apparent  complications and was taken to the recovery room in satisfactory condition.                                               Adolph Pollack, M.D.    Kari Baars  D:  01/29/2002  T:  01/30/2002  Job:  [id number]   cc:   Charna Elizabeth, M.D.   Juluis Mire, M.D.

## 2010-11-05 NOTE — Discharge Summary (Signed)
NAME:  Jeffery Castillo, Jeffery Castillo                       ACCOUNT NO.:  [id number]   MEDICAL RECORD NO.:  [id number]                   PATIENT TYPE:  INP   LOCATION:  0279                                 FACILITY:  [facility]   PHYSICIAN:  Lowell C. Catha Gosselin, M.D.               DATE OF BIRTH:  Dec 29, 1935   DATE OF ADMISSION:  03/20/2002  DATE OF DISCHARGE:  03/26/2002                                 DISCHARGE SUMMARY   DISCHARGE DIAGNOSES:  1. Febrile neutropenia secondary to recent 5-FU - resolved.  2. Colitis secondary to 5-FU - improved.  3. Stage 3 colon cancer status post cycle #1 5-FU/leucovorin completed     March 08, 2002.   CONSULTATIONS:  None.   PROCEDURES:  IV antibiotics.   HISTORY OF PRESENT ILLNESS:  The patient is a 75 year old gentleman who was  diagnosed with stage 3 colon cancer (T3 N1 M0) in August 2003.  He underwent  right colectomy on January 29, 2002 by Dr. Abbey Chatters.  Final pathology  [phone]) confirmed a moderately differentiated invasive colonic  adenocarcinoma with partial mucinous features invasive through muscularis  propria into pericolic adipose tissue with metastatic carcinoma in two of 10  lymph nodes.  Margins were negative.  The patient completed his first cycle  of 5-FU/leucovorin on March 08, 2002.  He presented to the office on  March 20, 2002 with fever of 102 degrees and complaints of diarrhea.  Lab  work in the office showed hemoglobin 12.3; white count 1.64; ANC 0.1; and a  platelet count of 181,000.  He was subsequently admitted for further  evaluation.   HOSPITAL COURSE:  Upon admission, two sets of peripheral blood cultures were  obtained, as well as a urine culture and chest x-ray.  The blood cultures  and urine culture remained negative.  Chest x-ray showed no active disease.  He was started on cefepime 2 g IV q.8h. and Neupogen 300 mcg subcu daily.  He continued to have temperature spikes greater than 102 degrees during the  first  three days of his admission.  Repeat blood cultures obtained on  March 23, 2002 did grow Staphylococcus species (coagulase negative) in one  of two.  This was felt to likely be a contaminant.  The antibiotics were  continued with improvement in his fevers by day #4.  He was afebrile for  greater than 24 hours prior to his discharge.  His white blood cell count  improved on the Neupogen.  White blood cell count on March 24, 2002 was  12.0 and ANC was 8.1.  At that time the Neupogen was discontinued.  On  March 25, 2002 antibiotic coverage was changed from cefepime to Augmentin.  We will discharge him home on Cipro 500 mg p.o. b.i.d. for seven days.   The patient had complaints of abdominal pain and diarrhea upon admission.  Abdominal x-ray on March 20, 2002 showed a mild ileus; no definite  obstruction.  Stool sample was negative for C. difficile.  His symptoms were  felt to be secondary to the recent  5-FU.  The abdominal pain and diarrhea improved during his hospitalization  with resolution prior to his discharge home.   On March 26, 2002 the patient was felt to stable for discharge home with  follow-up as an outpatient.  We have scheduled him a follow-up appointment  with Dr. Katrinka Blazing on Thursday, March 28, 2002 at 2:30 p.m.   LABORATORY DATA:  March 26, 2002:  Hemoglobin 11.2; white count 24.9;  platelets 253,000.  Sodium 141, potassium 3.7, BUN 11, creatinine 0.9,  glucose 96, calcium 8.3.   DISPOSITION AT DISCHARGE:  1. Condition:  Improved.  2. Activity:  No restrictions.  3. Diet:  No restrictions.  4. Wound care:  Continue to care for abdominal wound as prior to admission.  5. Special instructions:  The patient was instructed to call for fever     greater than or equal to 101 degrees, chills, worsening diarrhea,     shortness of breath, or any other problems.  6. Follow-up:  Dr. Katrinka Blazing, March 28, 2002 at 2:30 p.m.  7. Discharge medication:  Cipro 500 mg one p.o. b.i.d.  for seven days.         Lonna Cobb, N.P.                         Quintin Alto C. Catha Gosselin, M.D.    LT/MEDQ  D:  03/26/2002  T:  03/26/2002  Job:  [id number]   cc:   Merilynn Finland, M.D.  [address] [place] [facility] [place]  [place]  [place] [address]  Fax: [phone]

## 2010-11-05 NOTE — H&P (Signed)
NAME:  Jeffery Castillo, Jeffery Castillo                       ACCOUNT NO.:  [id number]   MEDICAL RECORD NO.:  [id number]                   PATIENT TYPE:  AMB   LOCATION:  ENDO                                 FACILITY:  [facility]   PHYSICIAN:  Merilynn Finland, M.D.                DATE OF BIRTH:  [birth year]   DATE OF ADMISSION:  03/20/2002  DATE OF DISCHARGE:                                HISTORY & PHYSICAL   HISTORY OF PRESENT ILLNESS:  The patient is a 75 year old gentleman with  colonic adenocarcinoma found to have a 6 cm lesion which was removed in June  2003 by Dr. [name].  He is now status post partial colectomy with  the pathology report revealing a T3, N1, MX lesion moderately differentiated  with three positive lymph nodes.  The patient has completed his first 5-day  cycle of 5-FU/leucovorin on March 08, 2002.  Today, he presented to the  office with fever over 102 with severe neutropenia.  The patient is followed  by Dr. Marnee Guarneri at [facility].   PAST MEDICAL HISTORY:  1. Prostate cancer.  2. Appendicitis.   PAST SURGICAL HISTORY:  1. Appendectomy.  2. Implantation of radioactive seeds in the prostate.   ALLERGIES:  He has no known drug allergies.   CURRENT MEDICATIONS:  He was started on Cipro last night by the physician on  call.  He has been taking vitamin B6 as well as some folic acid.   SOCIAL HISTORY:  He is married, without a history of drug use, he has used  tobacco in the past.   FAMILY HISTORY:  His mother died of congestive heart failure, his brother  had coronary artery disease and one sister died of lung cancer and one with  heart disease.   REVIEW OF SYSTEMS:  He has had no headache, no dizziness, no cough or chest  pain, he has had no palpitations.  He has had an overactive bowel as well as  diarrhea.  He has had again no swelling of his extremities, no sweats, he  has had some chills.  His appetite has remained fairly good.  He  has taken  over-the-counter Imodium for his loose stools.   PHYSICAL EXAMINATION:  VITAL SIGNS: Weight is 206 (this is down 9 pounds  within the last 8 days), blood pressure is 149/84, temperature 102 until  given Tylenol (it is now 99.3), pulse is 87, respirations are at 20.  HEENT: Sclerae are anicteric.  Normocephalic.  PERRLA.  EOMs are intact.  Oropharynx edentulous, dentures in place, there are no plaque or lesions in  the mouth.  NODES: There are no cervical, axillary, or inguinal nodes appreciated.  NECK: Supple.  CHEST: Clear to auscultation bilaterally.  CARDIOVASCULAR: Regular rate and rhythm; no murmur, no gallop.  ABDOMEN: Soft, there are a couple of areas of firmness over the liver, these  are rather nodule  during deep palpitation; no organomegaly.  The abdomen is  more firm than normal.  There seems to be a ridge just slightly to the right  of the umbilicus.  EXTREMITIES: There is no cyanosis, clubbing, or edema.  DTRs are 2+.  Strength is 5/5.   LABORATORIES:  WBC 1.64, ANC is 0.108, hemoglobin 12.3, hematocrit 34.4, MCV  83.6 and platelets at 181.   IMPRESSION AND PLAN:  This gentleman has a Dukes C colon cancer.  He is now  status post his first 5-day cycle of 5-FU/leucovorin in a standard [facility]  fashion.  He now presents to the office at day #11 with neutropenia and  fever.   1. Neutropenia/fever.  We will do blood cultures, UA, and stool cultures as     well as chest film and start antibiotic therapy and hydration.  2. Colon cancer.  He is now status post 5-FU/leucovorin cycle #1, diarrheal     treatment will be ordered post ___________.     Rosemarie Ax, N.P.                      Merilynn Finland, M.D.    Cordelia Pen  D:  03/20/2002  T:  03/20/2002  Job:  [id number]   cc:   Charna Elizabeth, M.D.  [address]  [place]  [place] [address]  Fax: [phone]   Juluis Mire, M.D.   Adolph Pollack, M.D.  Fax: [phone]

## 2010-11-05 NOTE — Procedures (Signed)
[place]  Patient:    Jeffery Castillo, Jeffery Castillo Visit Number: [id number] MRN: [id number]          Service Type: MED Location: 3W [phone] Attending Physician:  Gracelyn Nurse Dictated by:   Anselmo Rod, M.D. Proc. Date: 12/31/01 Admit Date:  11/22/2001 Discharge Date: 11/23/2001   CC:         Juluis Mire, M.D.  Adolph Pollack, M.D.   Procedure Report  DATE OF BIRTH:  1936/03/21.  PROCEDURE:  Colonoscopy with snare polypectomy x1 and cold biopsy x6.  ENDOSCOPIST:  Anselmo Rod, M.D.  INSTRUMENT USED:  Olympus video colonoscope.  INDICATION FOR PROCEDURE:  A 75 year old white male with a history of rectal bleeding on November 22, 2001.  Rule out colonic polyps, masses, hemorrhoids, etc.  PREPROCEDURE PREPARATION:  Informed consent was procured from the patient. The patient was fasted for eight hours prior to the procedure and prepped with a bottle of magnesium citrate and a gallon of NuLytely the night prior to the procedure.  PREPROCEDURE PHYSICAL:  VITAL SIGNS:  The patient had stable vital signs.  NECK:  Supple.  CHEST:  Clear to auscultation.  S1, S2 regular.  ABDOMEN:  Soft with normal bowel sounds.  DESCRIPTION OF PROCEDURE:  The patient was placed in the left lateral decubitus position and sedated with 100 mg of Demerol and 10 mg of Versed intravenously.  Once the patient was adequately sedate and maintained on low-flow oxygen and continuous cardiac monitoring, the Olympus video colonoscope was advanced from the rectum to the cecum with some difficulty. There was a large circumferential mass seen from 65-72 cm.  This had a necrotic center.  Multiple biopsies were done to rule out adenocarcinoma. There was scattered diverticular disease with with more prominent changes in the left colon.  A small sessile polyp was snared from the rectum.  The rest of the colonic mucosa beyond the splenic flexure up to the cecum  appeared normal.  IMPRESSION: 1. Small sessile polyp snared from the rectum. 2. Large circumferential mass present at 65-72 cm, biopsied x6. 3. Scattered diverticulosis with more prominent changes on the left colon. 4. Normal-appearing proximal, transverse colon, and right colon, including    cecum.  RECOMMENDATIONS: 1. Await pathology results. 2. Check CBC with differential, CEA levels, and hepatic function panel. 3. CT scan of the abdomen and pelvis. 4. Surgical consult with Dr. Abbey Chatters. Dictated by:   Anselmo Rod, M.D. Attending Physician:  Marcelino Duster D DD:  12/31/01 TD:  01/03/02 Job: [id number]

## 2010-11-05 NOTE — Op Note (Signed)
NAME:  Jeffery Castillo, Jeffery Castillo                         ACCOUNT NO.:  [id number]   MEDICAL RECORD NO.:  [id number]                   PATIENT TYPE:  AMB   LOCATION:  ENDO                                 FACILITY:  [facility]   PHYSICIAN:  Anselmo Rod, M.D.               DATE OF BIRTH:  04-26-1936   DATE OF PROCEDURE:  12/18/2002  DATE OF DISCHARGE:                                 OPERATIVE REPORT   PROCEDURE:  Surveillance colonoscopy performed in this 75 year old white  male whose status post extended right colectomy by Dr. [name] for  moderately differentiated invasive adenocarcinoma with mucinous features  done in August of 2003. The patient had metastatic carcinoma in 2/10 lymph  nodes resected. The patient denies any problems at this time and is doing  well from a GI standpoint.   PREPROCEDURE PREPARATION:  Informed consent was procured from the patient.  The patient fasted for eight hours prior to the procedure and prepped with a  bottle of magnesium citrate and a gallon of GoLYTELY the night prior to the  procedure.   PREPROCEDURE PHYSICAL:  The patient had stable vital signs. Neck supple.  Chest clear to auscultation.  Abdomen obese with well healed surgical scar  present from previous right colectomy.   DESCRIPTION OF PROCEDURE:  The patient was placed in the left lateral  decubitus position and sedated with 80 mg of Demerol and 8 mg of Versed  intravenously. Once the patient was adequately sedated and maintained on low  flow oxygen and continuous cardiac monitoring, the Olympus video colonoscope  was advanced from the rectum to the anastomosis without difficulty. A  healthy anastomosis was noted. The TI appeared healthy except for small  inflammatory lesion that was biopsied for pathology. There was evidence of  some early left sided diverticulosis. No other masses or polyps were seen  and retroflexion of the rectum revealed no abnormality. The patient  tolerated the  procedure well without complications.   IMPRESSION:  1. Small inflammatory polyp biopsied from the terminal ileum.  2. Left sided diverticulosis.  3. No other masses or polyps seen.   RECOMMENDATIONS:  1. Repeat colorectal cancer screening was recommended for surveillance     purposes in the next two years unless the patient develops any abnormal     symptoms in the interim.  2. Followup and further recommendations by Dr. Aliene Altes (oncology     service).  3. High fiber diet with liberal fluid intake.  4. Outpatient followup if need arises in the future.                                               Anselmo Rod, M.D.    JNM/MEDQ  D:  12/18/2002  T:  12/18/2002  Job:  [id number]  cc:   Juluis Mire, M.D.  [address]  [place]  [place] [address]  Fax: [phone]   Adolph Pollack, M.D.  [address]  [place]  [place] [address]  Fax: [phone]   J. Aliene Altes, M.D.  [address] [place] [facility]  [place]  [place] [address]  Fax: [phone]

## 2010-11-13 ENCOUNTER — Emergency Department (HOSPITAL_COMMUNITY): Payer: Medicare Other

## 2010-11-13 ENCOUNTER — Emergency Department (HOSPITAL_COMMUNITY)
Admission: EM | Admit: 2010-11-13 | Discharge: 2010-11-13 | Disposition: A | Discharge status: Home / Self Care | Payer: Medicare Other | Attending: Emergency Medicine | Admitting: Emergency Medicine

## 2010-11-13 DIAGNOSIS — R141 Gas pain: Secondary | ICD-10-CM | POA: Insufficient documentation

## 2010-11-13 DIAGNOSIS — R142 Eructation: Secondary | ICD-10-CM | POA: Insufficient documentation

## 2010-11-13 DIAGNOSIS — N1330 Unspecified hydronephrosis: Secondary | ICD-10-CM | POA: Insufficient documentation

## 2010-11-13 DIAGNOSIS — Z85038 Personal history of other malignant neoplasm of large intestine: Secondary | ICD-10-CM | POA: Insufficient documentation

## 2010-11-13 DIAGNOSIS — N201 Calculus of ureter: Secondary | ICD-10-CM | POA: Insufficient documentation

## 2010-11-13 DIAGNOSIS — R109 Unspecified abdominal pain: Secondary | ICD-10-CM | POA: Insufficient documentation

## 2010-11-13 DIAGNOSIS — M545 Low back pain, unspecified: Secondary | ICD-10-CM | POA: Insufficient documentation

## 2010-11-13 DIAGNOSIS — R55 Syncope and collapse: Secondary | ICD-10-CM | POA: Insufficient documentation

## 2010-11-13 DIAGNOSIS — Z8546 Personal history of malignant neoplasm of prostate: Secondary | ICD-10-CM | POA: Insufficient documentation

## 2010-11-13 LAB — URINALYSIS, ROUTINE W REFLEX MICROSCOPIC
Glucose, UA: 100 mg/dL — AB
Leukocytes, UA: NEGATIVE
pH: 6.5 (ref 5.0–8.0)

## 2010-11-13 LAB — COMPREHENSIVE METABOLIC PANEL
AST: 36 U/L (ref 0–37)
Albumin: 4.3 g/dL (ref 3.5–5.2)
Calcium: 9.7 mg/dL (ref 8.4–10.5)
Chloride: 100 mEq/L (ref 96–112)
Creatinine, Ser: 1.17 mg/dL (ref 0.4–1.5)
GFR calc Af Amer: >60 mL/min (ref >60)
Sodium: 138 mEq/L (ref 135–145)
Total Bilirubin: 0.5 mg/dL (ref 0.3–1.2)

## 2010-11-13 LAB — DIFFERENTIAL
Basophils Absolute: 0 10*3/uL (ref 0.0–0.1)
Basophils Relative: 0 % (ref 0–1)
Eosinophils Absolute: 0.1 10*3/uL (ref 0.0–0.7)
Eosinophils Relative: 1 % (ref 0–5)

## 2010-11-13 LAB — POCT I-STAT, CHEM 8
Calcium, Ion: 1.09 mmol/L — ABNORMAL LOW (ref 1.12–1.32)
Chloride: 106 mEq/L (ref 96–112)
HCT: 40 % (ref 39.0–52.0)
Potassium: 3.9 mEq/L (ref 3.5–5.1)
Sodium: 139 mEq/L (ref 135–145)

## 2010-11-13 LAB — CBC
MCV: 89.4 fL (ref 78.0–100.0)
Platelets: 194 10*3/uL (ref 150–400)
RDW: 12.3 % (ref 11.5–15.5)
WBC: 9.2 10*3/uL (ref 4.0–10.5)

## 2010-11-13 LAB — URINE MICROSCOPIC-ADD ON

## 2010-11-13 MED ORDER — IOHEXOL 350 MG/ML SOLN
100.0000 mL | Freq: Once | INTRAVENOUS | Status: AC | PRN
  Administered 2010-11-13: 100 mL via INTRAVENOUS

## 2010-11-15 LAB — URINE CULTURE

## 2011-01-24 ENCOUNTER — Other Ambulatory Visit: Payer: Self-pay | Admitting: Oncology

## 2011-01-24 ENCOUNTER — Encounter (HOSPITAL_BASED_OUTPATIENT_CLINIC_OR_DEPARTMENT_OTHER): Payer: Medicare Other | Admitting: Oncology

## 2011-01-24 DIAGNOSIS — Z85038 Personal history of other malignant neoplasm of large intestine: Secondary | ICD-10-CM

## 2011-01-24 DIAGNOSIS — Z8546 Personal history of malignant neoplasm of prostate: Secondary | ICD-10-CM

## 2011-01-24 LAB — CBC WITH DIFFERENTIAL/PLATELET
Basophils Absolute: 0.1 10*3/uL (ref 0.0–0.1)
EOS%: 1.6 % (ref 0.0–7.0)
HCT: 36.8 % — ABNORMAL LOW (ref 38.4–49.9)
HGB: 12.6 g/dL — ABNORMAL LOW (ref 13.0–17.1)
MCH: 31.7 pg (ref 27.2–33.4)
MCV: 92.6 fL (ref 79.3–98.0)
MONO%: 8.6 % (ref 0.0–14.0)
NEUT%: 44.2 % (ref 39.0–75.0)

## 2011-01-25 LAB — COMPREHENSIVE METABOLIC PANEL
ALT: 20 U/L (ref 0–53)
Alkaline Phosphatase: 35 U/L — ABNORMAL LOW (ref 39–117)
CO2: 25 mEq/L (ref 19–32)
Creatinine, Ser: 0.82 mg/dL (ref 0.50–1.35)
Total Bilirubin: 0.6 mg/dL (ref 0.3–1.2)

## 2011-01-25 LAB — PSA: PSA: 1.97 ng/mL (ref <=4.00)

## 2011-01-25 LAB — LACTATE DEHYDROGENASE: LDH: 162 U/L (ref 94–250)

## 2011-01-28 ENCOUNTER — Other Ambulatory Visit: Payer: Self-pay | Admitting: Oncology

## 2011-01-28 ENCOUNTER — Encounter (HOSPITAL_BASED_OUTPATIENT_CLINIC_OR_DEPARTMENT_OTHER): Payer: Medicare Other | Admitting: Oncology

## 2011-01-28 DIAGNOSIS — Z8546 Personal history of malignant neoplasm of prostate: Secondary | ICD-10-CM

## 2011-01-28 DIAGNOSIS — C189 Malignant neoplasm of colon, unspecified: Secondary | ICD-10-CM

## 2011-01-28 DIAGNOSIS — Z85038 Personal history of other malignant neoplasm of large intestine: Secondary | ICD-10-CM

## 2011-01-28 DIAGNOSIS — C61 Malignant neoplasm of prostate: Secondary | ICD-10-CM

## 2011-02-02 ENCOUNTER — Inpatient Hospital Stay (HOSPITAL_COMMUNITY)
Admission: RE | Admit: 2011-02-02 | Discharge: 2011-02-02 | Payer: Medicare Other | Source: Ambulatory Visit | Attending: Oncology | Admitting: Oncology

## 2011-02-02 ENCOUNTER — Encounter (HOSPITAL_COMMUNITY): Payer: Self-pay

## 2011-02-02 ENCOUNTER — Ambulatory Visit (HOSPITAL_COMMUNITY)
Admission: RE | Admit: 2011-02-02 | Discharge: 2011-02-02 | Disposition: A | Discharge status: Home / Self Care | Payer: Medicare Other | Source: Ambulatory Visit | Attending: Oncology | Admitting: Oncology

## 2011-02-02 DIAGNOSIS — Z9221 Personal history of antineoplastic chemotherapy: Secondary | ICD-10-CM | POA: Insufficient documentation

## 2011-02-02 DIAGNOSIS — Z923 Personal history of irradiation: Secondary | ICD-10-CM | POA: Insufficient documentation

## 2011-02-02 DIAGNOSIS — K5730 Diverticulosis of large intestine without perforation or abscess without bleeding: Secondary | ICD-10-CM | POA: Insufficient documentation

## 2011-02-02 DIAGNOSIS — C189 Malignant neoplasm of colon, unspecified: Secondary | ICD-10-CM | POA: Insufficient documentation

## 2011-02-02 DIAGNOSIS — I700 Atherosclerosis of aorta: Secondary | ICD-10-CM | POA: Insufficient documentation

## 2011-02-02 DIAGNOSIS — C61 Malignant neoplasm of prostate: Secondary | ICD-10-CM | POA: Insufficient documentation

## 2011-02-02 HISTORY — DX: Malignant (primary) neoplasm, unspecified: C80.1

## 2011-02-02 MED ORDER — IOHEXOL 300 MG/ML  SOLN
100.0000 mL | Freq: Once | INTRAMUSCULAR | Status: AC | PRN
  Administered 2011-02-02: 100 mL via INTRAVENOUS

## 2011-07-29 ENCOUNTER — Other Ambulatory Visit: Payer: Medicare Other | Admitting: Lab

## 2011-08-02 ENCOUNTER — Other Ambulatory Visit (HOSPITAL_BASED_OUTPATIENT_CLINIC_OR_DEPARTMENT_OTHER): Payer: Medicare Other | Admitting: Lab

## 2011-08-02 DIAGNOSIS — Z8546 Personal history of malignant neoplasm of prostate: Secondary | ICD-10-CM

## 2011-08-02 DIAGNOSIS — C184 Malignant neoplasm of transverse colon: Secondary | ICD-10-CM

## 2011-08-02 LAB — CBC WITH DIFFERENTIAL/PLATELET
Basophils Absolute: 0 10*3/uL (ref 0.0–0.1)
HCT: 37.4 % — ABNORMAL LOW (ref 38.4–49.9)
HGB: 13 g/dL (ref 13.0–17.1)
MONO#: 0.5 10*3/uL (ref 0.1–0.9)
NEUT#: 2.8 10*3/uL (ref 1.5–6.5)
NEUT%: 45.4 % (ref 39.0–75.0)
RDW: 12.5 % (ref 11.0–14.6)
WBC: 6.2 10*3/uL (ref 4.0–10.3)
lymph#: 2.8 10*3/uL (ref 0.9–3.3)

## 2011-08-02 LAB — MORPHOLOGY: PLT EST: ADEQUATE

## 2011-08-04 LAB — COMPREHENSIVE METABOLIC PANEL
ALT: 20 U/L (ref 0–53)
AST: 25 U/L (ref 0–37)
Albumin: 4.2 g/dL (ref 3.5–5.2)
Alkaline Phosphatase: 40 U/L (ref 39–117)
BUN: 14 mg/dL (ref 6–23)
Calcium: 9.7 mg/dL (ref 8.4–10.5)
Chloride: 102 mEq/L (ref 96–112)
Creatinine, Ser: 0.79 mg/dL (ref 0.50–1.35)
Potassium: 4.1 mEq/L (ref 3.5–5.3)

## 2011-08-04 LAB — IMMUNOFIXATION ELECTROPHORESIS
IgG (Immunoglobin G), Serum: 1370 mg/dL (ref 650–1600)
IgM, Serum: 46 mg/dL (ref 41–251)
Total Protein, Serum Electrophoresis: 7.4 g/dL (ref 6.0–8.3)

## 2011-08-09 ENCOUNTER — Telehealth: Payer: Self-pay | Admitting: Oncology

## 2011-08-09 ENCOUNTER — Encounter: Payer: Self-pay | Admitting: Oncology

## 2011-08-09 ENCOUNTER — Ambulatory Visit (HOSPITAL_BASED_OUTPATIENT_CLINIC_OR_DEPARTMENT_OTHER): Payer: Medicare Other | Admitting: Oncology

## 2011-08-09 VITALS — BP 172/75 | HR 94 | Temp 97.6°F | Ht 70.0 in | Wt 223.6 lb

## 2011-08-09 DIAGNOSIS — Z85038 Personal history of other malignant neoplasm of large intestine: Secondary | ICD-10-CM

## 2011-08-09 DIAGNOSIS — E119 Type 2 diabetes mellitus without complications: Secondary | ICD-10-CM

## 2011-08-09 DIAGNOSIS — Z8546 Personal history of malignant neoplasm of prostate: Secondary | ICD-10-CM

## 2011-08-09 DIAGNOSIS — M5020 Other cervical disc displacement, unspecified cervical region: Secondary | ICD-10-CM

## 2011-08-09 DIAGNOSIS — I10 Essential (primary) hypertension: Secondary | ICD-10-CM

## 2011-08-09 DIAGNOSIS — N200 Calculus of kidney: Secondary | ICD-10-CM | POA: Insufficient documentation

## 2011-08-09 DIAGNOSIS — C61 Malignant neoplasm of prostate: Secondary | ICD-10-CM

## 2011-08-09 HISTORY — DX: Essential (primary) hypertension: I10

## 2011-08-09 HISTORY — DX: Malignant neoplasm of prostate: C61

## 2011-08-09 HISTORY — DX: Type 2 diabetes mellitus without complications: E11.9

## 2011-08-09 HISTORY — DX: Calculus of kidney: N20.0

## 2011-08-09 HISTORY — DX: Other cervical disc displacement, unspecified cervical region: M50.20

## 2011-08-09 NOTE — Telephone Encounter (Signed)
appts made and printed for 2/22,5/14 and 5/21.  Pt requested am on 5/21
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
## 2011-08-12 ENCOUNTER — Telehealth: Payer: Self-pay | Admitting: *Deleted

## 2011-08-12 ENCOUNTER — Ambulatory Visit (HOSPITAL_COMMUNITY)
Admission: RE | Admit: 2011-08-12 | Discharge: 2011-08-12 | Disposition: A | Discharge status: Home / Self Care | Payer: Medicare Other | Source: Ambulatory Visit | Attending: Oncology | Admitting: Oncology

## 2011-08-12 ENCOUNTER — Encounter (HOSPITAL_COMMUNITY): Payer: Self-pay

## 2011-08-12 DIAGNOSIS — C61 Malignant neoplasm of prostate: Secondary | ICD-10-CM

## 2011-08-12 MED ORDER — TECHNETIUM TC 99M MEDRONATE IV KIT
24.0000 | PACK | Freq: Once | INTRAVENOUS | Status: AC | PRN
  Administered 2011-08-12: 24 via INTRAVENOUS

## 2011-08-12 NOTE — Telephone Encounter (Signed)
Message copied by Sabino Snipes on Fri Aug 12, 2011  3:53 PM ------      Message from: Levert Feinstein      Created: Fri Aug 12, 2011  3:18 PM       Call pt CXR & bone scan normal

## 2011-08-12 NOTE — Telephone Encounter (Signed)
Notified pt that CXR & bone scan done recently normal per Dr. Cyndie Chime.

## 2011-10-28 DIAGNOSIS — Z9889 Other specified postprocedural states: Secondary | ICD-10-CM | POA: Insufficient documentation

## 2011-10-31 DIAGNOSIS — Z9889 Other specified postprocedural states: Secondary | ICD-10-CM

## 2011-10-31 HISTORY — DX: Other specified postprocedural states: Z98.890

## 2011-11-01 ENCOUNTER — Other Ambulatory Visit (HOSPITAL_BASED_OUTPATIENT_CLINIC_OR_DEPARTMENT_OTHER): Payer: Medicare Other | Admitting: Lab

## 2011-11-01 DIAGNOSIS — Z8546 Personal history of malignant neoplasm of prostate: Secondary | ICD-10-CM

## 2011-11-01 DIAGNOSIS — C61 Malignant neoplasm of prostate: Secondary | ICD-10-CM

## 2011-11-01 LAB — PSA: PSA: 2.08 ng/mL (ref <=4.00)

## 2011-11-08 ENCOUNTER — Encounter: Payer: Self-pay | Admitting: Oncology

## 2011-11-08 ENCOUNTER — Ambulatory Visit (HOSPITAL_BASED_OUTPATIENT_CLINIC_OR_DEPARTMENT_OTHER): Payer: Medicare Other | Admitting: Oncology

## 2011-11-08 ENCOUNTER — Telehealth: Payer: Self-pay | Admitting: Oncology

## 2011-11-08 VITALS — BP 162/88 | HR 91 | Temp 96.9°F | Ht 70.0 in | Wt 220.1 lb

## 2011-11-08 DIAGNOSIS — R160 Hepatomegaly, not elsewhere classified: Secondary | ICD-10-CM

## 2011-11-08 DIAGNOSIS — Z85038 Personal history of other malignant neoplasm of large intestine: Secondary | ICD-10-CM

## 2011-11-08 DIAGNOSIS — Z9889 Other specified postprocedural states: Secondary | ICD-10-CM

## 2011-11-08 DIAGNOSIS — C189 Malignant neoplasm of colon, unspecified: Secondary | ICD-10-CM

## 2011-11-08 DIAGNOSIS — C61 Malignant neoplasm of prostate: Secondary | ICD-10-CM

## 2011-11-08 HISTORY — DX: Hepatomegaly, not elsewhere classified: R16.0

## 2011-11-08 NOTE — Progress Notes (Signed)
Hematology and Oncology Follow Up Visit  Jeffery Castillo [id number] [birth year] 76 y.o. 11/08/2011 12:24 PM   Principle Diagnosis: Encounter Diagnoses  Name Primary?  . Colon cancer Yes  . Prostate ca   . H/O repair of right rotator cuff   . Hepatomegaly      Interim History:   Short interim followup visit for this 76 year old man with a history of stage III colon cancer and Gleason 3+3 prostate cancer. At time of his most recent visit here in February of this year, I noted a further increase in his PSA which had doubled over a one-year interval. He was asymptomatic. I obtained a chest radiograph and a bone scan done February 22 and they showed no evidence for metastatic disease.  Since the treatment of his prostate cancer was radiation therapy, he would not be a candidate for additional pelvic radiation. A repeat PSA done in anticipation of today's visit is 2.08 down from the February 12 value of 2.41. I think we can safely continue observation alone.  He reports that he had right rotator cuff surgery on May 11. He then went to [facility] and by May 16 he was already playing golf. Quite amazing. He continues to feel well. He has had no other interim problems.  Medications: reviewed  Allergies: No Known Allergies  Review of Systems: Constitutional: No constitutional symptoms   Respiratory: No cough or dyspnea Cardiovascular: No chest pain or palpitations  Gastrointestinal: No change in bowel habit Genito-Urinary: No problems with urination Musculoskeletal: He denies any bone pain Neurologic: No headache or change in vision Skin: No rash or ecchymosis Remaining ROS negative.  Physical Exam: Blood pressure 162/88, pulse 91, temperature 96.9 F (36.1 C), temperature source Oral, height 5\' 10"  (1.778 m), weight 220 lb 1.6 oz (99.837 kg). Wt Readings from Last 3 Encounters:  11/08/11 220 lb 1.6 oz (99.837 kg)  08/09/11 223 lb 9.6 oz (101.424 kg)     General appearance:  Well-nourished Caucasian man HENNT: Pharynx no erythema or exudate Lymph nodes: No adenopathy Breasts: Lungs: Clear to auscultation resonant to percussion Heart: Regular rhythm Abdomen: Soft nontender no mass; persistent hepatomegaly liver approximately 17 cm below right costal margin smooth edge.. nontender Extremities: No edema no calf tenderness Vascular: No cyanosis Neurologic: No focal deficit Skin: No rash  Lab Results: Lab Results  Component Value Date   WBC 6.2 08/02/2011   HGB 13.0 08/02/2011   HCT 37.4* 08/02/2011   MCV 92.5 08/02/2011   PLT 209 08/02/2011     Chemistry      Component Value Date/Time   NA 139 08/02/2011 1338   K 4.1 08/02/2011 1338   CL 102 08/02/2011 1338   CO2 25 08/02/2011 1338   BUN 14 08/02/2011 1338   CREATININE 0.79 08/02/2011 1338      Component Value Date/Time   CALCIUM 9.7 08/02/2011 1338   ALKPHOS 40 08/02/2011 1338   AST 25 08/02/2011 1338   ALT 20 08/02/2011 1338   BILITOT 0.6 08/02/2011 1338       Radiological Studies: See discussion above    Impression and Plan: #1. Gleason 3+3 prostate cancer treated with primary radiation in 2000. Detectable and slowly rising PSA but overall stable and no evidence for metastatic disease based on results outlined above. Plan is to continue to monitor him with clinical exams and PSA tests.  #2. Stage III 2 node positive adenocarcinoma transverse colon diagnosed August of 2003 treated with surgical resection followed by 1 cycle of  5-FU leucovorin chemotherapy stopped for toxicity. He is scheduled to have a followup colonoscopy next month by Dr. Loreta Ave.  #3. History of cervical disc herniation requiring surgery September 2010  #4. Recent right rotator cuff surgery may/2013  #5. Essential hypertension  #6. Nephrolithiasis  #7. Type 2 diabetes   CC:.    Levert Feinstein, MD 5/21/201312:24 PM

## 2011-11-08 NOTE — Telephone Encounter (Signed)
Gave pt appt for November 2013 lab and MD °

## 2011-12-21 ENCOUNTER — Encounter: Payer: Self-pay | Admitting: Oncology

## 2012-04-26 ENCOUNTER — Telehealth: Payer: Self-pay | Admitting: Oncology

## 2012-04-26 NOTE — Telephone Encounter (Signed)
Pt called and r/s appt of lab from 11/12 to 04/30/12

## 2012-04-30 ENCOUNTER — Other Ambulatory Visit: Payer: Medicare Other

## 2012-04-30 ENCOUNTER — Telehealth: Payer: Self-pay | Admitting: Oncology

## 2012-04-30 NOTE — Telephone Encounter (Signed)
Talked to patient and gave him appt date for 11/22 for lab and MD on 11/29, r/s from 11/12 due to MD's call day

## 2012-05-01 ENCOUNTER — Other Ambulatory Visit: Payer: Medicare Other | Admitting: Lab

## 2012-05-08 ENCOUNTER — Ambulatory Visit: Payer: Medicare Other | Admitting: Oncology

## 2012-05-11 ENCOUNTER — Other Ambulatory Visit (HOSPITAL_BASED_OUTPATIENT_CLINIC_OR_DEPARTMENT_OTHER): Payer: Medicare Other

## 2012-05-11 DIAGNOSIS — R160 Hepatomegaly, not elsewhere classified: Secondary | ICD-10-CM

## 2012-05-11 DIAGNOSIS — C184 Malignant neoplasm of transverse colon: Secondary | ICD-10-CM

## 2012-05-11 DIAGNOSIS — C61 Malignant neoplasm of prostate: Secondary | ICD-10-CM

## 2012-05-11 DIAGNOSIS — C189 Malignant neoplasm of colon, unspecified: Secondary | ICD-10-CM

## 2012-05-11 LAB — COMPREHENSIVE METABOLIC PANEL (CC13)
ALT: 27 U/L (ref 0–55)
AST: 33 U/L (ref 5–34)
Albumin: 3.9 g/dL (ref 3.5–5.0)
Alkaline Phosphatase: 55 U/L (ref 40–150)
BUN: 11 mg/dL (ref 7.0–26.0)
Chloride: 107 mEq/L (ref 98–107)
Potassium: 4 mEq/L (ref 3.5–5.1)
Sodium: 139 mEq/L (ref 136–145)

## 2012-05-11 LAB — CBC WITH DIFFERENTIAL/PLATELET
Basophils Absolute: 0.1 10*3/uL (ref 0.0–0.1)
Eosinophils Absolute: 0.2 10*3/uL (ref 0.0–0.5)
HCT: 36.9 % — ABNORMAL LOW (ref 38.4–49.9)
LYMPH%: 36.6 % (ref 14.0–49.0)
MCV: 94 fL (ref 79.3–98.0)
MONO#: 0.5 10*3/uL (ref 0.1–0.9)
MONO%: 7 % (ref 0.0–14.0)
NEUT#: 3.5 10*3/uL (ref 1.5–6.5)
NEUT%: 52.7 % (ref 39.0–75.0)
Platelets: 159 10*3/uL (ref 140–400)
WBC: 6.7 10*3/uL (ref 4.0–10.3)

## 2012-05-14 LAB — HEPATITIS PANEL, ACUTE
HCV Ab: NEGATIVE
Hep A IgM: NEGATIVE
Hep B C IgM: NEGATIVE

## 2012-05-14 LAB — CEA: CEA: 0.6 ng/mL (ref 0.0–5.0)

## 2012-05-18 ENCOUNTER — Ambulatory Visit (HOSPITAL_BASED_OUTPATIENT_CLINIC_OR_DEPARTMENT_OTHER): Payer: Medicare Other | Admitting: Oncology

## 2012-05-18 ENCOUNTER — Telehealth: Payer: Self-pay | Admitting: Oncology

## 2012-05-18 VITALS — BP 161/79 | HR 78 | Temp 98.4°F | Resp 20 | Ht 70.0 in | Wt 221.2 lb

## 2012-05-18 DIAGNOSIS — Z85038 Personal history of other malignant neoplasm of large intestine: Secondary | ICD-10-CM

## 2012-05-18 DIAGNOSIS — R160 Hepatomegaly, not elsewhere classified: Secondary | ICD-10-CM

## 2012-05-18 DIAGNOSIS — C61 Malignant neoplasm of prostate: Secondary | ICD-10-CM

## 2012-05-18 NOTE — Telephone Encounter (Signed)
gv and printed appt schedule for pt for NOV 2014 °

## 2012-05-18 NOTE — Progress Notes (Signed)
Hematology and Oncology Follow Up Visit  Jeffery Castillo [id number] 03/25/1936 76 y.o. 05/18/2012 1:14 PM   Principle Diagnosis: Encounter Diagnoses  Name Primary?  . Prostate ca Yes  . H/O colon cancer, stage III   . Hepatomegaly      Interim History:   Followup visit for this pleasant 76 year old man with history of stage III adenocarcinoma of the colon with 2 nodes positive diagnosed in August 2003, treated with surgical resection of the primary in the transverse colon, followed by 5-FU and leucovorin chemotherapy.  He subsequently was diagnosed with a Gleason 3 plus 3 prostate cancer in July 2007, treated with radioactive seed implants.   His most recent visit here in August 2012, his hemoglobin had fallen slightly below his baseline, he had persistent unexplained hepatomegaly with normal liver functions. I obtained a CT scan of the abdomen and pelvis which did not show any evidence for metastatic recurrence of his cancer. Liver was noted to be normal. Incidentally noted were the residual radioactive seeds in his prostate.  I referred him back to Dr. Loreta Ave and he underwent a colonoscopy on 11/21/2011 which was normal. Surgical anastomosis looked good. There were no polyps.  He is still having discomfort in his right shoulder. He is status post a previous C3 through C7 anterior decompression procedure for a traumatic herniated cervical spine in September 2010. He reports no change in bowel habits. No abdominal pain. No hematochezia or melena. No urinary frequency or dysuria. No hematuria.    Medications: reviewed  Allergies: No Known Allergies  Review of Systems: Constitutional:   No constitutional symptoms Respiratory: No cough or dyspnea Cardiovascular:  No chest pain or palpitations Gastrointestinal: See above  Genito-Urinary: See above Musculoskeletal: See above Neurologic: No headache or change in vision Skin: No rash Remaining ROS negative.  Physical Exam: Blood  pressure 161/79, pulse 78, temperature 98.4 F (36.9 C), temperature source Oral, resp. rate 20, height 5\' 10"  (1.778 m), weight 221 lb 3.2 oz (100.336 kg). Wt Readings from Last 3 Encounters:  05/18/12 221 lb 3.2 oz (100.336 kg)  11/08/11 220 lb 1.6 oz (99.837 kg)  08/09/11 223 lb 9.6 oz (101.424 kg)     General appearance: Well-nourished Caucasian man HENNT: Pharynx no erythema or exudate Lymph nodes: No adenopathy Breasts: Lungs: Clear to auscultation resonant to percussion Heart: Regular rhythm no murmur Abdomen: Soft, nontender, there were still enlarged on my exam about 10 cm below right costal margin smooth edge Extremities: No edema, no calf tenderness Vascular: No cyanosis Neurologic: Grossly normal Skin: No rash  Lab Results: Lab Results  Component Value Date   WBC 6.7 05/11/2012   HGB 13.0 05/11/2012   HCT 36.9* 05/11/2012   MCV 94.0 05/11/2012   PLT 159 05/11/2012     Chemistry      Component Value Date/Time   NA 139 05/11/2012 0806   NA 139 08/02/2011 1338   K 4.0 05/11/2012 0806   K 4.1 08/02/2011 1338   CL 107 05/11/2012 0806   CL 102 08/02/2011 1338   CO2 23 05/11/2012 0806   CO2 25 08/02/2011 1338   BUN 11.0 05/11/2012 0806   BUN 14 08/02/2011 1338   CREATININE 0.9 05/11/2012 0806   CREATININE 0.79 08/02/2011 1338      Component Value Date/Time   CALCIUM 9.5 05/11/2012 0806   CALCIUM 9.7 08/02/2011 1338   ALKPHOS 55 05/11/2012 0806   ALKPHOS 40 08/02/2011 1338   AST 33 05/11/2012 0806   AST 25  08/02/2011 1338   ALT 27 05/11/2012 0806   ALT 20 08/02/2011 1338   BILITOT 0.60 05/11/2012 0806   BILITOT 0.6 08/02/2011 1338    CEA 0.6 PSA 2.4 done on 05/11/2012. This compares with 2.08 done 11/01/2011, 2.41 on February 12, 1.97 on 01/24/2011, 1.49 on 01/22/2010.  Impression and Plan: #1. Stage 3 colon cancer treated as outlined above. He remains free of any obvious recurrence now out over 10 years from diagnosis.  #2. Gleason 3+3 prostate cancer treated  with radioactive seed implants. Some minor fluctuations in his PSA but stable overall. No suspicion for recurrent disease at this time.  #3. Idiopathic hepatomegaly with normal liver functions and normal appearing liver on CT scan  #4. Status post cervical decompression surgery for traumatic herniated disc   CC:. Dr. Larina Earthly Dr. [name]; Dr. Alba Destine, MD 11/29/20131:14 PM

## 2012-08-20 ENCOUNTER — Other Ambulatory Visit: Payer: Self-pay | Admitting: Orthopedic Surgery

## 2012-08-21 ENCOUNTER — Encounter (HOSPITAL_COMMUNITY): Payer: Self-pay | Admitting: Pharmacy Technician

## 2012-08-28 ENCOUNTER — Encounter (HOSPITAL_COMMUNITY): Payer: Self-pay

## 2012-08-28 ENCOUNTER — Encounter (HOSPITAL_COMMUNITY)
Admission: RE | Admit: 2012-08-28 | Discharge: 2012-08-28 | Disposition: A | Discharge status: Home / Self Care | Payer: Medicare Other | Source: Ambulatory Visit | Attending: Orthopedic Surgery | Admitting: Orthopedic Surgery

## 2012-08-28 DIAGNOSIS — Z01812 Encounter for preprocedural laboratory examination: Secondary | ICD-10-CM | POA: Insufficient documentation

## 2012-08-28 HISTORY — DX: Pain, unspecified: R52

## 2012-08-28 HISTORY — DX: Other seasonal allergic rhinitis: J30.2

## 2012-08-28 HISTORY — DX: Unspecified osteoarthritis, unspecified site: M19.90

## 2012-08-28 LAB — URINALYSIS, ROUTINE W REFLEX MICROSCOPIC
Glucose, UA: NEGATIVE mg/dL
Ketones, ur: NEGATIVE mg/dL
Leukocytes, UA: NEGATIVE
pH: 5 (ref 5.0–8.0)

## 2012-08-28 LAB — CBC WITH DIFFERENTIAL/PLATELET
Basophils Relative: 1 % (ref 0–1)
Eosinophils Absolute: 0.1 10*3/uL (ref 0.0–0.7)
Eosinophils Relative: 2 % (ref 0–5)
Lymphs Abs: 2.9 10*3/uL (ref 0.7–4.0)
MCH: 31.6 pg (ref 26.0–34.0)
MCHC: 34.9 g/dL (ref 30.0–36.0)
MCV: 90.3 fL (ref 78.0–100.0)
Neutrophils Relative %: 51 % (ref 43–77)
Platelets: 174 10*3/uL (ref 150–400)
RBC: 4.12 MIL/uL — ABNORMAL LOW (ref 4.22–5.81)
RDW: 12.2 % (ref 11.5–15.5)

## 2012-08-28 LAB — COMPREHENSIVE METABOLIC PANEL
BUN: 11 mg/dL (ref 6–23)
CO2: 24 mEq/L (ref 19–32)
Chloride: 102 mEq/L (ref 96–112)
Creatinine, Ser: 0.76 mg/dL (ref 0.50–1.35)
GFR calc non Af Amer: 88 mL/min — ABNORMAL LOW (ref >90)
Total Bilirubin: 0.4 mg/dL (ref 0.3–1.2)

## 2012-08-28 LAB — ABO/RH: ABO/RH(D): O NEG

## 2012-08-28 LAB — PROTIME-INR
INR: 1.09 (ref 0.00–1.49)
Prothrombin Time: 14 seconds (ref 11.6–15.2)

## 2012-08-28 NOTE — Progress Notes (Signed)
08/28/12 1130  OBSTRUCTIVE SLEEP APNEA  Have you ever been diagnosed with sleep apnea through a sleep study? No  Do you snore loudly (loud enough to be heard through closed doors)?  0  Do you often feel tired, fatigued, or sleepy during the daytime? 1  Has anyone observed you stop breathing during your sleep? 0  Do you have, or are you being treated for high blood pressure? 1  BMI more than 35 kg/m2? 0  Age over 75 years old? 1  Neck circumference greater than 40 cm/18 inches? 0  Gender: 1  Obstructive Sleep Apnea Score 4  Score 4 or greater  Results sent to PCP

## 2012-08-28 NOTE — Patient Instructions (Signed)
YOUR SURGERY IS SCHEDULED AT [facility]  ON:  Thursday  3/20  REPORT TO  SHORT STAY CENTER AT:  5:30 AM      PHONE # FOR SHORT STAY IS [phone]  DO NOT EAT OR DRINK ANYTHING AFTER MIDNIGHT THE NIGHT BEFORE YOUR SURGERY.  YOU MAY BRUSH YOUR TEETH, RINSE OUT YOUR MOUTH--BUT NO WATER, NO FOOD, NO CHEWING GUM, NO MINTS, NO CANDIES, NO CHEWING TOBACCO.  PLEASE TAKE THE FOLLOWING MEDICATIONS THE AM OF YOUR SURGERY WITH A FEW SIPS OF WATER:  NO MEDICINES TO TAKE  IF YOU USE INHALERS--USE YOUR INHALERS THE AM OF YOUR SURGERY AND BRING INHALERS TO THE HOSPITAL.    IF YOU ARE DIABETIC:  DO NOT TAKE ANY DIABETIC MEDICATIONS THE AM OF YOUR SURGERY.  IF YOU TAKE INSULIN IN THE EVENINGS--PLEASE ONLY TAKE 1/2 NORMAL EVENING DOSE THE NIGHT BEFORE YOUR SURGERY.  NO INSULIN THE AM OF YOUR SURGERY.  IF YOU HAVE SLEEP APNEA AND USE CPAP OR BIPAP--PLEASE BRING THE MASK AND THE TUBING.  DO NOT BRING YOUR MACHINE.  DO NOT BRING VALUABLES, MONEY, CREDIT CARDS.  DO NOT WEAR JEWELRY, MAKE-UP, NAIL POLISH AND NO METAL PINS OR CLIPS IN YOUR HAIR. CONTACT LENS, DENTURES / PARTIALS, GLASSES SHOULD NOT BE WORN TO SURGERY AND IN MOST CASES-HEARING AIDS WILL NEED TO BE REMOVED.  BRING YOUR GLASSES CASE, ANY EQUIPMENT NEEDED FOR YOUR CONTACT LENS. FOR PATIENTS ADMITTED TO THE HOSPITAL--CHECK OUT TIME THE DAY OF DISCHARGE IS 11:00 AM.  ALL INPATIENT ROOMS ARE PRIVATE - WITH BATHROOM, TELEPHONE, TELEVISION AND WIFI INTERNET.  IF YOU ARE BEING DISCHARGED THE SAME DAY OF YOUR SURGERY--YOU CAN NOT DRIVE YOURSELF HOME--AND SHOULD NOT GO HOME ALONE BY TAXI OR BUS.  NO DRIVING OR OPERATING MACHINERY FOR 24 HOURS FOLLOWING ANESTHESIA / PAIN MEDICATIONS.  PLEASE MAKE ARRANGEMENTS FOR SOMEONE TO BE WITH YOU AT HOME THE FIRST 24 HOURS AFTER SURGERY. RESPONSIBLE DRIVER'S NAME___________________________                                               PHONE #   _______________________                                PLEASE READ OVER ANY  FACT SHEETS THAT YOU WERE GIVEN: MRSA INFORMATION, BLOOD TRANSFUSION INFORMATION, INCENTIVE SPIROMETER INFORMATION. FAILURE TO FOLLOW THESE INSTRUCTIONS MAY RESULT IN THE CANCELLATION OF YOUR SURGERY.   PATIENT SIGNATURE_________________________________

## 2012-08-28 NOTE — Pre-Procedure Instructions (Signed)
PT'S EKG REPORT 09/19/11 AND CXR REPORT 10/26/11 ON PT'S CHART FROM DR. Felipa Eth.

## 2012-08-29 NOTE — Pre-Procedure Instructions (Signed)
FAXED NOTE SENT TO DR. CHANDLER THAT PT'S NASAL PCR POSITIVE FOR MRSA AND STAPH AUREUS AND I NOTIFIED KARLA IN SURGERY SCHEDULING FOR DR. CHANDLER OF THE POSITIVE MRSA AND FAX--SHE WILL ALSO SEND HIM MESSAGE ABOUT THE POSITIVE MRSA PCR.

## 2012-09-06 ENCOUNTER — Encounter (HOSPITAL_COMMUNITY): Admission: RE | Payer: Self-pay | Source: Ambulatory Visit

## 2012-09-06 ENCOUNTER — Inpatient Hospital Stay (HOSPITAL_COMMUNITY): Admission: RE | Admit: 2012-09-06 | Payer: Medicare Other | Source: Ambulatory Visit | Admitting: Orthopedic Surgery

## 2012-09-06 SURGERY — ARTHROPLASTY, SHOULDER, TOTAL
Anesthesia: Choice | Site: Shoulder | Laterality: Right

## 2013-01-16 ENCOUNTER — Other Ambulatory Visit: Payer: Self-pay | Admitting: Neurosurgery

## 2013-01-16 DIAGNOSIS — M5020 Other cervical disc displacement, unspecified cervical region: Secondary | ICD-10-CM

## 2013-01-23 ENCOUNTER — Ambulatory Visit
Admission: RE | Admit: 2013-01-23 | Discharge: 2013-01-23 | Disposition: A | Discharge status: Home / Self Care | Payer: Medicare Other | Source: Ambulatory Visit | Attending: Neurosurgery | Admitting: Neurosurgery

## 2013-01-23 DIAGNOSIS — M5020 Other cervical disc displacement, unspecified cervical region: Secondary | ICD-10-CM

## 2013-05-17 ENCOUNTER — Ambulatory Visit (HOSPITAL_BASED_OUTPATIENT_CLINIC_OR_DEPARTMENT_OTHER): Payer: Medicare Other | Admitting: Oncology

## 2013-05-17 ENCOUNTER — Other Ambulatory Visit (HOSPITAL_BASED_OUTPATIENT_CLINIC_OR_DEPARTMENT_OTHER): Payer: Medicare Other | Admitting: Lab

## 2013-05-17 VITALS — BP 160/75 | HR 94 | Temp 97.8°F | Resp 20 | Ht 70.0 in | Wt 211.2 lb

## 2013-05-17 DIAGNOSIS — C61 Malignant neoplasm of prostate: Secondary | ICD-10-CM

## 2013-05-17 DIAGNOSIS — Z8546 Personal history of malignant neoplasm of prostate: Secondary | ICD-10-CM

## 2013-05-17 DIAGNOSIS — Z85038 Personal history of other malignant neoplasm of large intestine: Secondary | ICD-10-CM

## 2013-05-17 DIAGNOSIS — R160 Hepatomegaly, not elsewhere classified: Secondary | ICD-10-CM

## 2013-05-17 LAB — COMPREHENSIVE METABOLIC PANEL (CC13)
ALT: 20 U/L (ref 0–55)
AST: 29 U/L (ref 5–34)
Alkaline Phosphatase: 50 U/L (ref 40–150)
Sodium: 140 mEq/L (ref 136–145)
Total Bilirubin: 0.72 mg/dL (ref 0.20–1.20)
Total Protein: 8.6 g/dL — ABNORMAL HIGH (ref 6.4–8.3)

## 2013-05-17 LAB — CBC WITH DIFFERENTIAL/PLATELET
BASO%: 0.9 % (ref 0.0–2.0)
HCT: 38.5 % (ref 38.4–49.9)
MCHC: 34.8 g/dL (ref 32.0–36.0)
MONO#: 0.4 10*3/uL (ref 0.1–0.9)
NEUT%: 51.5 % (ref 39.0–75.0)
RBC: 4.26 10*6/uL (ref 4.20–5.82)
RDW: 12.3 % (ref 11.0–14.6)
WBC: 8 10*3/uL (ref 4.0–10.3)
lymph#: 3.3 10*3/uL (ref 0.9–3.3)

## 2013-05-17 LAB — PSA: PSA: 2.7 ng/mL (ref <=4.00)

## 2013-05-17 LAB — CEA: CEA: 0.7 ng/mL (ref 0.0–5.0)

## 2013-05-17 LAB — LACTATE DEHYDROGENASE (CC13): LDH: 159 U/L (ref 125–245)
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
## 2013-05-23 ENCOUNTER — Telehealth: Payer: Self-pay | Admitting: *Deleted

## 2013-05-23 NOTE — Telephone Encounter (Signed)
Message copied by Gala Romney on Thu May 23, 2013 11:39 AM ------      Message from: Levert Feinstein      Created: Tue May 21, 2013  7:03 AM       Call pt PSA 2.7  Little change from 1 year ago - was 2.4       Please forward result to primary care MD ------

## 2013-05-23 NOTE — Telephone Encounter (Signed)
Per Dr. Cyndie Chime; notified pt that PSA 2.7, little change from 1 year ago--was 2.4.  Copy of labs sent to PCP.  Pt verbalized understanding of information.

## 2015-04-10 DIAGNOSIS — Z23 Encounter for immunization: Secondary | ICD-10-CM | POA: Diagnosis not present

## 2015-07-09 DIAGNOSIS — H903 Sensorineural hearing loss, bilateral: Secondary | ICD-10-CM | POA: Diagnosis not present

## 2015-07-09 DIAGNOSIS — R42 Dizziness and giddiness: Secondary | ICD-10-CM | POA: Diagnosis not present

## 2015-07-09 DIAGNOSIS — H6063 Unspecified chronic otitis externa, bilateral: Secondary | ICD-10-CM | POA: Diagnosis not present

## 2015-07-16 DIAGNOSIS — Z0000 Encounter for general adult medical examination without abnormal findings: Secondary | ICD-10-CM | POA: Diagnosis not present

## 2015-07-16 DIAGNOSIS — E039 Hypothyroidism, unspecified: Secondary | ICD-10-CM | POA: Diagnosis not present

## 2015-07-16 DIAGNOSIS — E038 Other specified hypothyroidism: Secondary | ICD-10-CM | POA: Diagnosis not present

## 2015-07-16 DIAGNOSIS — Z125 Encounter for screening for malignant neoplasm of prostate: Secondary | ICD-10-CM | POA: Diagnosis not present

## 2015-07-16 DIAGNOSIS — E119 Type 2 diabetes mellitus without complications: Secondary | ICD-10-CM | POA: Diagnosis not present

## 2015-07-16 DIAGNOSIS — E1165 Type 2 diabetes mellitus with hyperglycemia: Secondary | ICD-10-CM | POA: Diagnosis not present

## 2015-08-06 DIAGNOSIS — J42 Unspecified chronic bronchitis: Secondary | ICD-10-CM | POA: Diagnosis not present

## 2015-08-06 DIAGNOSIS — E1165 Type 2 diabetes mellitus with hyperglycemia: Secondary | ICD-10-CM | POA: Diagnosis not present

## 2015-08-06 DIAGNOSIS — Z0000 Encounter for general adult medical examination without abnormal findings: Secondary | ICD-10-CM | POA: Diagnosis not present

## 2015-08-06 DIAGNOSIS — C189 Malignant neoplasm of colon, unspecified: Secondary | ICD-10-CM | POA: Diagnosis not present

## 2015-08-06 DIAGNOSIS — C61 Malignant neoplasm of prostate: Secondary | ICD-10-CM | POA: Diagnosis not present

## 2015-08-06 DIAGNOSIS — E784 Other hyperlipidemia: Secondary | ICD-10-CM | POA: Diagnosis not present

## 2015-08-06 DIAGNOSIS — H8109 Meniere's disease, unspecified ear: Secondary | ICD-10-CM | POA: Diagnosis not present

## 2015-08-06 DIAGNOSIS — I10 Essential (primary) hypertension: Secondary | ICD-10-CM | POA: Diagnosis not present

## 2015-08-06 DIAGNOSIS — E038 Other specified hypothyroidism: Secondary | ICD-10-CM | POA: Diagnosis not present

## 2015-08-06 DIAGNOSIS — M7541 Impingement syndrome of right shoulder: Secondary | ICD-10-CM | POA: Diagnosis not present

## 2015-08-11 DIAGNOSIS — H903 Sensorineural hearing loss, bilateral: Secondary | ICD-10-CM | POA: Diagnosis not present

## 2015-08-11 DIAGNOSIS — R42 Dizziness and giddiness: Secondary | ICD-10-CM | POA: Diagnosis not present

## 2015-08-11 DIAGNOSIS — H6063 Unspecified chronic otitis externa, bilateral: Secondary | ICD-10-CM | POA: Diagnosis not present

## 2015-11-27 DIAGNOSIS — I10 Essential (primary) hypertension: Secondary | ICD-10-CM | POA: Diagnosis not present

## 2015-11-27 DIAGNOSIS — E1165 Type 2 diabetes mellitus with hyperglycemia: Secondary | ICD-10-CM | POA: Diagnosis not present

## 2015-11-27 DIAGNOSIS — Z6831 Body mass index (BMI) 31.0-31.9, adult: Secondary | ICD-10-CM | POA: Diagnosis not present

## 2015-11-27 DIAGNOSIS — H8103 Meniere's disease, bilateral: Secondary | ICD-10-CM | POA: Diagnosis not present

## 2015-11-27 DIAGNOSIS — E038 Other specified hypothyroidism: Secondary | ICD-10-CM | POA: Diagnosis not present

## 2015-11-27 DIAGNOSIS — R0789 Other chest pain: Secondary | ICD-10-CM | POA: Diagnosis not present

## 2015-12-03 ENCOUNTER — Other Ambulatory Visit (HOSPITAL_COMMUNITY): Payer: Self-pay | Admitting: Internal Medicine

## 2015-12-03 DIAGNOSIS — R079 Chest pain, unspecified: Secondary | ICD-10-CM

## 2015-12-09 ENCOUNTER — Other Ambulatory Visit (HOSPITAL_COMMUNITY): Payer: Self-pay | Admitting: Internal Medicine

## 2015-12-09 ENCOUNTER — Ambulatory Visit (HOSPITAL_COMMUNITY)
Admission: RE | Admit: 2015-12-09 | Discharge: 2015-12-09 | Disposition: A | Discharge status: Home / Self Care | Payer: Medicare HMO | Source: Ambulatory Visit | Attending: Internal Medicine | Admitting: Internal Medicine

## 2015-12-09 DIAGNOSIS — R079 Chest pain, unspecified: Secondary | ICD-10-CM

## 2015-12-09 DIAGNOSIS — I071 Rheumatic tricuspid insufficiency: Secondary | ICD-10-CM | POA: Insufficient documentation

## 2015-12-09 DIAGNOSIS — I119 Hypertensive heart disease without heart failure: Secondary | ICD-10-CM | POA: Diagnosis not present

## 2015-12-09 DIAGNOSIS — I340 Nonrheumatic mitral (valve) insufficiency: Secondary | ICD-10-CM | POA: Insufficient documentation

## 2015-12-09 DIAGNOSIS — E119 Type 2 diabetes mellitus without complications: Secondary | ICD-10-CM | POA: Insufficient documentation

## 2015-12-09 LAB — ECHOCARDIOGRAM COMPLETE
EERAT: 10.05
EWDT: 324 ms
FS: 26 % — AB (ref 28–44)
IVS/LV PW RATIO, ED: 1
LA ID, A-P, ES: 48 mm
LA diam index: 2.18 cm/m2
LA vol index: 19.8 mL/m2
LAVOL: 43.6 mL
LAVOLA4C: 29.1 mL
LDCA: 3.14 cm2
LEFT ATRIUM END SYS DIAM: 48 mm
LV E/e' medial: 10.05
LV E/e'average: 10.05
LV PW d: 14.2 mm — AB (ref 0.6–1.1)
LV TDI E'LATERAL: 6.09
LV TDI E'MEDIAL: 4.24
LVELAT: 6.09 cm/s
LVOTD: 20 mm
MV Dec: 324
MV pk A vel: 94.3 m/s
MV pk E vel: 61.2 m/s
RV TAPSE: 25.4 mm
Reg peak vel: 246 cm/s
TR max vel: 246 cm/s

## 2015-12-09 NOTE — Progress Notes (Signed)
*  PRELIMINARY RESULTS* Echocardiogram 2D Echocardiogram has been performed.  Jeffery Castillo 12/09/2015, 1:59 PM

## 2016-02-03 DIAGNOSIS — Z23 Encounter for immunization: Secondary | ICD-10-CM | POA: Diagnosis not present

## 2016-02-03 DIAGNOSIS — R0789 Other chest pain: Secondary | ICD-10-CM | POA: Diagnosis not present

## 2016-02-03 DIAGNOSIS — I10 Essential (primary) hypertension: Secondary | ICD-10-CM | POA: Diagnosis not present

## 2016-02-03 DIAGNOSIS — E038 Other specified hypothyroidism: Secondary | ICD-10-CM | POA: Diagnosis not present

## 2016-02-03 DIAGNOSIS — E119 Type 2 diabetes mellitus without complications: Secondary | ICD-10-CM | POA: Diagnosis not present

## 2016-02-03 DIAGNOSIS — M7541 Impingement syndrome of right shoulder: Secondary | ICD-10-CM | POA: Diagnosis not present

## 2016-03-25 DIAGNOSIS — J42 Unspecified chronic bronchitis: Secondary | ICD-10-CM | POA: Diagnosis not present

## 2016-03-25 DIAGNOSIS — I10 Essential (primary) hypertension: Secondary | ICD-10-CM | POA: Diagnosis not present

## 2016-03-25 DIAGNOSIS — Z0000 Encounter for general adult medical examination without abnormal findings: Secondary | ICD-10-CM | POA: Diagnosis not present

## 2016-04-19 DIAGNOSIS — R0789 Other chest pain: Secondary | ICD-10-CM | POA: Diagnosis not present

## 2016-04-19 DIAGNOSIS — R0600 Dyspnea, unspecified: Secondary | ICD-10-CM | POA: Diagnosis not present

## 2016-04-19 DIAGNOSIS — G453 Amaurosis fugax: Secondary | ICD-10-CM | POA: Diagnosis not present

## 2016-04-19 DIAGNOSIS — Z6830 Body mass index (BMI) 30.0-30.9, adult: Secondary | ICD-10-CM | POA: Diagnosis not present

## 2016-04-19 DIAGNOSIS — I493 Ventricular premature depolarization: Secondary | ICD-10-CM | POA: Diagnosis not present

## 2016-04-19 DIAGNOSIS — I10 Essential (primary) hypertension: Secondary | ICD-10-CM | POA: Diagnosis not present

## 2016-05-16 ENCOUNTER — Other Ambulatory Visit (HOSPITAL_COMMUNITY): Payer: Self-pay | Admitting: Internal Medicine

## 2016-05-16 DIAGNOSIS — G453 Amaurosis fugax: Secondary | ICD-10-CM

## 2016-05-17 ENCOUNTER — Ambulatory Visit (HOSPITAL_COMMUNITY)
Admission: RE | Admit: 2016-05-17 | Discharge: 2016-05-17 | Disposition: A | Discharge status: Home / Self Care | Payer: Medicare HMO | Source: Ambulatory Visit | Attending: Internal Medicine | Admitting: Internal Medicine

## 2016-05-17 ENCOUNTER — Ambulatory Visit (HOSPITAL_COMMUNITY): Payer: Medicare HMO

## 2016-05-17 ENCOUNTER — Encounter (HOSPITAL_COMMUNITY): Payer: Self-pay | Admitting: Radiology

## 2016-05-17 DIAGNOSIS — I6601 Occlusion and stenosis of right middle cerebral artery: Secondary | ICD-10-CM | POA: Insufficient documentation

## 2016-05-17 DIAGNOSIS — R0789 Other chest pain: Secondary | ICD-10-CM | POA: Insufficient documentation

## 2016-05-17 DIAGNOSIS — I6502 Occlusion and stenosis of left vertebral artery: Secondary | ICD-10-CM | POA: Diagnosis not present

## 2016-05-17 DIAGNOSIS — G319 Degenerative disease of nervous system, unspecified: Secondary | ICD-10-CM | POA: Insufficient documentation

## 2016-05-17 DIAGNOSIS — I493 Ventricular premature depolarization: Secondary | ICD-10-CM | POA: Diagnosis not present

## 2016-05-17 DIAGNOSIS — I6522 Occlusion and stenosis of left carotid artery: Secondary | ICD-10-CM | POA: Diagnosis not present

## 2016-05-17 DIAGNOSIS — G453 Amaurosis fugax: Secondary | ICD-10-CM

## 2016-05-17 LAB — CREATININE, SERUM
Creatinine, Ser: 1.04 mg/dL (ref 0.61–1.24)
GFR calc Af Amer: >60 mL/min (ref >60)
GFR calc non Af Amer: >60 mL/min (ref >60)

## 2016-05-17 MED ORDER — GADOBENATE DIMEGLUMINE 529 MG/ML IV SOLN
20.0000 mL | Freq: Once | INTRAVENOUS | Status: AC | PRN
  Administered 2016-05-17: 20 mL via INTRAVENOUS

## 2016-08-04 DIAGNOSIS — Z0000 Encounter for general adult medical examination without abnormal findings: Secondary | ICD-10-CM | POA: Diagnosis not present

## 2016-08-05 DIAGNOSIS — Z125 Encounter for screening for malignant neoplasm of prostate: Secondary | ICD-10-CM | POA: Diagnosis not present

## 2016-08-05 DIAGNOSIS — E784 Other hyperlipidemia: Secondary | ICD-10-CM | POA: Diagnosis not present

## 2016-08-05 DIAGNOSIS — E1165 Type 2 diabetes mellitus with hyperglycemia: Secondary | ICD-10-CM | POA: Diagnosis not present

## 2016-08-05 DIAGNOSIS — E038 Other specified hypothyroidism: Secondary | ICD-10-CM | POA: Diagnosis not present

## 2016-08-19 DIAGNOSIS — Z0000 Encounter for general adult medical examination without abnormal findings: Secondary | ICD-10-CM | POA: Diagnosis not present

## 2016-08-19 DIAGNOSIS — M5030 Other cervical disc degeneration, unspecified cervical region: Secondary | ICD-10-CM | POA: Diagnosis not present

## 2016-08-19 DIAGNOSIS — E784 Other hyperlipidemia: Secondary | ICD-10-CM | POA: Diagnosis not present

## 2016-08-19 DIAGNOSIS — H8109 Meniere's disease, unspecified ear: Secondary | ICD-10-CM | POA: Diagnosis not present

## 2016-08-19 DIAGNOSIS — I10 Essential (primary) hypertension: Secondary | ICD-10-CM | POA: Diagnosis not present

## 2016-08-19 DIAGNOSIS — C189 Malignant neoplasm of colon, unspecified: Secondary | ICD-10-CM | POA: Diagnosis not present

## 2016-08-19 DIAGNOSIS — E038 Other specified hypothyroidism: Secondary | ICD-10-CM | POA: Diagnosis not present

## 2016-08-19 DIAGNOSIS — C61 Malignant neoplasm of prostate: Secondary | ICD-10-CM | POA: Diagnosis not present

## 2016-08-19 DIAGNOSIS — G453 Amaurosis fugax: Secondary | ICD-10-CM | POA: Diagnosis not present

## 2016-08-19 DIAGNOSIS — E119 Type 2 diabetes mellitus without complications: Secondary | ICD-10-CM | POA: Diagnosis not present

## 2016-11-22 DIAGNOSIS — H6990 Unspecified Eustachian tube disorder, unspecified ear: Secondary | ICD-10-CM | POA: Diagnosis not present

## 2016-11-22 DIAGNOSIS — J0100 Acute maxillary sinusitis, unspecified: Secondary | ICD-10-CM | POA: Diagnosis not present

## 2016-11-22 DIAGNOSIS — R062 Wheezing: Secondary | ICD-10-CM | POA: Diagnosis not present

## 2016-11-22 DIAGNOSIS — J42 Unspecified chronic bronchitis: Secondary | ICD-10-CM | POA: Diagnosis not present

## 2016-11-22 DIAGNOSIS — Z6830 Body mass index (BMI) 30.0-30.9, adult: Secondary | ICD-10-CM | POA: Diagnosis not present

## 2016-11-22 DIAGNOSIS — E119 Type 2 diabetes mellitus without complications: Secondary | ICD-10-CM | POA: Diagnosis not present

## 2016-11-22 DIAGNOSIS — J302 Other seasonal allergic rhinitis: Secondary | ICD-10-CM | POA: Diagnosis not present

## 2016-11-22 DIAGNOSIS — I10 Essential (primary) hypertension: Secondary | ICD-10-CM | POA: Diagnosis not present

## 2016-12-17 DIAGNOSIS — R011 Cardiac murmur, unspecified: Secondary | ICD-10-CM | POA: Diagnosis not present

## 2016-12-17 DIAGNOSIS — J309 Allergic rhinitis, unspecified: Secondary | ICD-10-CM | POA: Diagnosis not present

## 2016-12-17 DIAGNOSIS — Z0000 Encounter for general adult medical examination without abnormal findings: Secondary | ICD-10-CM | POA: Diagnosis not present

## 2016-12-17 DIAGNOSIS — G4700 Insomnia, unspecified: Secondary | ICD-10-CM | POA: Diagnosis not present

## 2016-12-17 DIAGNOSIS — Z6828 Body mass index (BMI) 28.0-28.9, adult: Secondary | ICD-10-CM | POA: Diagnosis not present

## 2016-12-17 DIAGNOSIS — Z972 Presence of dental prosthetic device (complete) (partial): Secondary | ICD-10-CM | POA: Diagnosis not present

## 2016-12-17 DIAGNOSIS — I10 Essential (primary) hypertension: Secondary | ICD-10-CM | POA: Diagnosis not present

## 2017-01-03 DIAGNOSIS — Z85038 Personal history of other malignant neoplasm of large intestine: Secondary | ICD-10-CM | POA: Diagnosis not present

## 2017-01-03 DIAGNOSIS — K5730 Diverticulosis of large intestine without perforation or abscess without bleeding: Secondary | ICD-10-CM | POA: Diagnosis not present

## 2017-01-03 DIAGNOSIS — Z1211 Encounter for screening for malignant neoplasm of colon: Secondary | ICD-10-CM | POA: Diagnosis not present

## 2017-03-03 DIAGNOSIS — M5030 Other cervical disc degeneration, unspecified cervical region: Secondary | ICD-10-CM | POA: Diagnosis not present

## 2017-03-03 DIAGNOSIS — Z23 Encounter for immunization: Secondary | ICD-10-CM | POA: Diagnosis not present

## 2017-03-03 DIAGNOSIS — E784 Other hyperlipidemia: Secondary | ICD-10-CM | POA: Diagnosis not present

## 2017-03-03 DIAGNOSIS — G453 Amaurosis fugax: Secondary | ICD-10-CM | POA: Diagnosis not present

## 2017-03-03 DIAGNOSIS — I10 Essential (primary) hypertension: Secondary | ICD-10-CM | POA: Diagnosis not present

## 2017-03-03 DIAGNOSIS — Z6830 Body mass index (BMI) 30.0-30.9, adult: Secondary | ICD-10-CM | POA: Diagnosis not present

## 2017-03-03 DIAGNOSIS — J302 Other seasonal allergic rhinitis: Secondary | ICD-10-CM | POA: Diagnosis not present

## 2017-03-03 DIAGNOSIS — E119 Type 2 diabetes mellitus without complications: Secondary | ICD-10-CM | POA: Diagnosis not present

## 2017-05-19 DIAGNOSIS — Z6830 Body mass index (BMI) 30.0-30.9, adult: Secondary | ICD-10-CM | POA: Diagnosis not present

## 2017-05-19 DIAGNOSIS — J440 Chronic obstructive pulmonary disease with acute lower respiratory infection: Secondary | ICD-10-CM | POA: Diagnosis not present

## 2017-05-19 DIAGNOSIS — R05 Cough: Secondary | ICD-10-CM | POA: Diagnosis not present

## 2017-06-23 DIAGNOSIS — J449 Chronic obstructive pulmonary disease, unspecified: Secondary | ICD-10-CM | POA: Diagnosis not present

## 2017-06-23 DIAGNOSIS — Z6830 Body mass index (BMI) 30.0-30.9, adult: Secondary | ICD-10-CM | POA: Diagnosis not present

## 2017-06-23 DIAGNOSIS — R05 Cough: Secondary | ICD-10-CM | POA: Diagnosis not present

## 2017-08-03 DIAGNOSIS — H26491 Other secondary cataract, right eye: Secondary | ICD-10-CM | POA: Diagnosis not present

## 2017-09-01 DIAGNOSIS — I10 Essential (primary) hypertension: Secondary | ICD-10-CM | POA: Diagnosis not present

## 2017-09-01 DIAGNOSIS — E1165 Type 2 diabetes mellitus with hyperglycemia: Secondary | ICD-10-CM | POA: Diagnosis not present

## 2017-09-01 DIAGNOSIS — E038 Other specified hypothyroidism: Secondary | ICD-10-CM | POA: Diagnosis not present

## 2017-09-01 DIAGNOSIS — R82998 Other abnormal findings in urine: Secondary | ICD-10-CM | POA: Diagnosis not present

## 2017-09-01 DIAGNOSIS — E7849 Other hyperlipidemia: Secondary | ICD-10-CM | POA: Diagnosis not present

## 2017-09-01 DIAGNOSIS — Z125 Encounter for screening for malignant neoplasm of prostate: Secondary | ICD-10-CM | POA: Diagnosis not present

## 2017-09-06 DIAGNOSIS — I10 Essential (primary) hypertension: Secondary | ICD-10-CM | POA: Diagnosis not present

## 2017-09-06 DIAGNOSIS — E038 Other specified hypothyroidism: Secondary | ICD-10-CM | POA: Diagnosis not present

## 2017-09-06 DIAGNOSIS — E119 Type 2 diabetes mellitus without complications: Secondary | ICD-10-CM | POA: Diagnosis not present

## 2017-09-06 DIAGNOSIS — C61 Malignant neoplasm of prostate: Secondary | ICD-10-CM | POA: Diagnosis not present

## 2017-09-06 DIAGNOSIS — C189 Malignant neoplasm of colon, unspecified: Secondary | ICD-10-CM | POA: Diagnosis not present

## 2017-09-06 DIAGNOSIS — J42 Unspecified chronic bronchitis: Secondary | ICD-10-CM | POA: Diagnosis not present

## 2017-09-06 DIAGNOSIS — E7849 Other hyperlipidemia: Secondary | ICD-10-CM | POA: Diagnosis not present

## 2017-09-06 DIAGNOSIS — Z0000 Encounter for general adult medical examination without abnormal findings: Secondary | ICD-10-CM | POA: Diagnosis not present

## 2017-09-06 DIAGNOSIS — G453 Amaurosis fugax: Secondary | ICD-10-CM | POA: Diagnosis not present

## 2017-09-06 DIAGNOSIS — J449 Chronic obstructive pulmonary disease, unspecified: Secondary | ICD-10-CM | POA: Diagnosis not present

## 2017-09-15 DIAGNOSIS — Z1212 Encounter for screening for malignant neoplasm of rectum: Secondary | ICD-10-CM | POA: Diagnosis not present

## 2017-10-09 DIAGNOSIS — R42 Dizziness and giddiness: Secondary | ICD-10-CM | POA: Diagnosis not present

## 2017-10-09 DIAGNOSIS — E119 Type 2 diabetes mellitus without complications: Secondary | ICD-10-CM | POA: Diagnosis not present

## 2017-10-09 DIAGNOSIS — Z6827 Body mass index (BMI) 27.0-27.9, adult: Secondary | ICD-10-CM | POA: Diagnosis not present

## 2017-10-09 DIAGNOSIS — R51 Headache: Secondary | ICD-10-CM | POA: Diagnosis not present

## 2017-10-09 DIAGNOSIS — M542 Cervicalgia: Secondary | ICD-10-CM | POA: Diagnosis not present

## 2017-10-09 DIAGNOSIS — I10 Essential (primary) hypertension: Secondary | ICD-10-CM | POA: Diagnosis not present

## 2017-10-09 DIAGNOSIS — R269 Unspecified abnormalities of gait and mobility: Secondary | ICD-10-CM | POA: Diagnosis not present

## 2017-10-10 ENCOUNTER — Other Ambulatory Visit: Payer: Self-pay | Admitting: Internal Medicine

## 2017-10-10 DIAGNOSIS — M542 Cervicalgia: Secondary | ICD-10-CM

## 2017-10-10 DIAGNOSIS — R519 Headache, unspecified: Secondary | ICD-10-CM

## 2017-10-10 DIAGNOSIS — R51 Headache: Principal | ICD-10-CM

## 2017-10-12 ENCOUNTER — Other Ambulatory Visit: Payer: Medicare HMO

## 2017-10-24 ENCOUNTER — Ambulatory Visit
Admission: RE | Admit: 2017-10-24 | Discharge: 2017-10-24 | Disposition: A | Discharge status: Home / Self Care | Payer: Medicare HMO | Source: Ambulatory Visit | Attending: Internal Medicine | Admitting: Internal Medicine

## 2017-10-24 DIAGNOSIS — M542 Cervicalgia: Secondary | ICD-10-CM

## 2017-10-24 DIAGNOSIS — R519 Headache, unspecified: Secondary | ICD-10-CM

## 2017-10-24 DIAGNOSIS — R51 Headache: Secondary | ICD-10-CM | POA: Diagnosis not present

## 2017-10-24 DIAGNOSIS — M4322 Fusion of spine, cervical region: Secondary | ICD-10-CM | POA: Diagnosis not present

## 2017-11-30 DIAGNOSIS — J449 Chronic obstructive pulmonary disease, unspecified: Secondary | ICD-10-CM | POA: Diagnosis not present

## 2017-11-30 DIAGNOSIS — G453 Amaurosis fugax: Secondary | ICD-10-CM | POA: Diagnosis not present

## 2017-11-30 DIAGNOSIS — R51 Headache: Secondary | ICD-10-CM | POA: Diagnosis not present

## 2017-11-30 DIAGNOSIS — I10 Essential (primary) hypertension: Secondary | ICD-10-CM | POA: Diagnosis not present

## 2017-11-30 DIAGNOSIS — Z6827 Body mass index (BMI) 27.0-27.9, adult: Secondary | ICD-10-CM | POA: Diagnosis not present

## 2017-11-30 DIAGNOSIS — E7849 Other hyperlipidemia: Secondary | ICD-10-CM | POA: Diagnosis not present

## 2017-11-30 DIAGNOSIS — E119 Type 2 diabetes mellitus without complications: Secondary | ICD-10-CM | POA: Diagnosis not present

## 2017-12-01 ENCOUNTER — Other Ambulatory Visit (HOSPITAL_COMMUNITY): Payer: Self-pay | Admitting: Internal Medicine

## 2017-12-01 DIAGNOSIS — G453 Amaurosis fugax: Secondary | ICD-10-CM

## 2017-12-04 ENCOUNTER — Ambulatory Visit (HOSPITAL_COMMUNITY)
Admission: RE | Admit: 2017-12-04 | Discharge: 2017-12-04 | Disposition: A | Discharge status: Home / Self Care | Payer: Medicare HMO | Source: Ambulatory Visit | Attending: Surgery | Admitting: Surgery

## 2017-12-04 DIAGNOSIS — I6523 Occlusion and stenosis of bilateral carotid arteries: Secondary | ICD-10-CM | POA: Diagnosis not present

## 2017-12-04 DIAGNOSIS — G453 Amaurosis fugax: Secondary | ICD-10-CM

## 2017-12-20 DIAGNOSIS — I10 Essential (primary) hypertension: Secondary | ICD-10-CM | POA: Diagnosis not present

## 2017-12-20 DIAGNOSIS — M47812 Spondylosis without myelopathy or radiculopathy, cervical region: Secondary | ICD-10-CM | POA: Diagnosis not present

## 2017-12-20 DIAGNOSIS — M542 Cervicalgia: Secondary | ICD-10-CM | POA: Diagnosis not present

## 2017-12-20 DIAGNOSIS — M5020 Other cervical disc displacement, unspecified cervical region: Secondary | ICD-10-CM | POA: Diagnosis not present

## 2017-12-20 DIAGNOSIS — M5481 Occipital neuralgia: Secondary | ICD-10-CM | POA: Diagnosis not present

## 2017-12-22 DIAGNOSIS — H8109 Meniere's disease, unspecified ear: Secondary | ICD-10-CM | POA: Diagnosis not present

## 2017-12-22 DIAGNOSIS — Z6827 Body mass index (BMI) 27.0-27.9, adult: Secondary | ICD-10-CM | POA: Diagnosis not present

## 2017-12-22 DIAGNOSIS — Z79899 Other long term (current) drug therapy: Secondary | ICD-10-CM | POA: Diagnosis not present

## 2017-12-22 DIAGNOSIS — M542 Cervicalgia: Secondary | ICD-10-CM | POA: Diagnosis not present

## 2018-03-05 DIAGNOSIS — E119 Type 2 diabetes mellitus without complications: Secondary | ICD-10-CM | POA: Diagnosis not present

## 2018-03-05 DIAGNOSIS — R51 Headache: Secondary | ICD-10-CM | POA: Diagnosis not present

## 2018-03-05 DIAGNOSIS — E038 Other specified hypothyroidism: Secondary | ICD-10-CM | POA: Diagnosis not present

## 2018-03-05 DIAGNOSIS — I10 Essential (primary) hypertension: Secondary | ICD-10-CM | POA: Diagnosis not present

## 2018-03-05 DIAGNOSIS — E7849 Other hyperlipidemia: Secondary | ICD-10-CM | POA: Diagnosis not present

## 2018-03-05 DIAGNOSIS — Z6827 Body mass index (BMI) 27.0-27.9, adult: Secondary | ICD-10-CM | POA: Diagnosis not present

## 2018-03-31 DIAGNOSIS — R69 Illness, unspecified: Secondary | ICD-10-CM | POA: Diagnosis not present

## 2018-06-19 DIAGNOSIS — H9201 Otalgia, right ear: Secondary | ICD-10-CM | POA: Diagnosis not present

## 2018-06-19 DIAGNOSIS — H6121 Impacted cerumen, right ear: Secondary | ICD-10-CM | POA: Diagnosis not present

## 2018-06-19 DIAGNOSIS — Z8669 Personal history of other diseases of the nervous system and sense organs: Secondary | ICD-10-CM | POA: Diagnosis not present

## 2018-07-04 DIAGNOSIS — H60391 Other infective otitis externa, right ear: Secondary | ICD-10-CM | POA: Diagnosis not present

## 2018-07-04 DIAGNOSIS — H903 Sensorineural hearing loss, bilateral: Secondary | ICD-10-CM | POA: Diagnosis not present

## 2018-07-04 DIAGNOSIS — R42 Dizziness and giddiness: Secondary | ICD-10-CM | POA: Diagnosis not present

## 2018-07-04 DIAGNOSIS — H6063 Unspecified chronic otitis externa, bilateral: Secondary | ICD-10-CM | POA: Diagnosis not present

## 2018-07-17 ENCOUNTER — Other Ambulatory Visit: Payer: Self-pay | Admitting: Internal Medicine

## 2018-07-17 ENCOUNTER — Other Ambulatory Visit (HOSPITAL_COMMUNITY): Payer: Self-pay | Admitting: Internal Medicine

## 2018-07-17 ENCOUNTER — Ambulatory Visit (HOSPITAL_COMMUNITY): Payer: Medicare HMO

## 2018-07-17 DIAGNOSIS — M545 Low back pain, unspecified: Secondary | ICD-10-CM

## 2018-07-17 DIAGNOSIS — Z6826 Body mass index (BMI) 26.0-26.9, adult: Secondary | ICD-10-CM | POA: Diagnosis not present

## 2018-07-17 DIAGNOSIS — M5416 Radiculopathy, lumbar region: Secondary | ICD-10-CM

## 2018-07-17 DIAGNOSIS — M25551 Pain in right hip: Secondary | ICD-10-CM | POA: Diagnosis not present

## 2018-07-17 DIAGNOSIS — R102 Pelvic and perineal pain: Secondary | ICD-10-CM

## 2018-07-18 ENCOUNTER — Emergency Department (HOSPITAL_BASED_OUTPATIENT_CLINIC_OR_DEPARTMENT_OTHER): Payer: Medicare HMO

## 2018-07-18 ENCOUNTER — Encounter (HOSPITAL_BASED_OUTPATIENT_CLINIC_OR_DEPARTMENT_OTHER): Payer: Self-pay | Admitting: Emergency Medicine

## 2018-07-18 ENCOUNTER — Emergency Department (HOSPITAL_BASED_OUTPATIENT_CLINIC_OR_DEPARTMENT_OTHER)
Admission: EM | Admit: 2018-07-18 | Discharge: 2018-07-18 | Disposition: A | Discharge status: Home / Self Care | Payer: Medicare HMO | Attending: Emergency Medicine | Admitting: Emergency Medicine

## 2018-07-18 ENCOUNTER — Other Ambulatory Visit: Payer: Self-pay

## 2018-07-18 DIAGNOSIS — R531 Weakness: Secondary | ICD-10-CM | POA: Insufficient documentation

## 2018-07-18 DIAGNOSIS — M5441 Lumbago with sciatica, right side: Secondary | ICD-10-CM | POA: Diagnosis not present

## 2018-07-18 DIAGNOSIS — M25551 Pain in right hip: Secondary | ICD-10-CM | POA: Diagnosis not present

## 2018-07-18 DIAGNOSIS — Y9289 Other specified places as the place of occurrence of the external cause: Secondary | ICD-10-CM | POA: Diagnosis not present

## 2018-07-18 DIAGNOSIS — I10 Essential (primary) hypertension: Secondary | ICD-10-CM | POA: Insufficient documentation

## 2018-07-18 DIAGNOSIS — S3992XA Unspecified injury of lower back, initial encounter: Secondary | ICD-10-CM | POA: Diagnosis not present

## 2018-07-18 DIAGNOSIS — Y998 Other external cause status: Secondary | ICD-10-CM | POA: Diagnosis not present

## 2018-07-18 DIAGNOSIS — Z79899 Other long term (current) drug therapy: Secondary | ICD-10-CM | POA: Diagnosis not present

## 2018-07-18 DIAGNOSIS — M545 Low back pain: Secondary | ICD-10-CM | POA: Diagnosis not present

## 2018-07-18 DIAGNOSIS — Y9389 Activity, other specified: Secondary | ICD-10-CM | POA: Insufficient documentation

## 2018-07-18 DIAGNOSIS — W11XXXA Fall on and from ladder, initial encounter: Secondary | ICD-10-CM | POA: Diagnosis not present

## 2018-07-18 DIAGNOSIS — R29898 Other symptoms and signs involving the musculoskeletal system: Secondary | ICD-10-CM

## 2018-07-18 DIAGNOSIS — Z87891 Personal history of nicotine dependence: Secondary | ICD-10-CM | POA: Insufficient documentation

## 2018-07-18 DIAGNOSIS — R200 Anesthesia of skin: Secondary | ICD-10-CM

## 2018-07-18 LAB — COMPREHENSIVE METABOLIC PANEL
ALT: 10 U/L (ref 0–44)
AST: 18 U/L (ref 15–41)
Albumin: 3.8 g/dL (ref 3.5–5.0)
Alkaline Phosphatase: 49 U/L (ref 38–126)
Anion gap: 9 (ref 5–15)
BUN: 15 mg/dL (ref 8–23)
CO2: 22 mmol/L (ref 22–32)
Calcium: 8.7 mg/dL — ABNORMAL LOW (ref 8.9–10.3)
Chloride: 106 mmol/L (ref 98–111)
Creatinine, Ser: 0.96 mg/dL (ref 0.61–1.24)
GFR calc non Af Amer: >60 mL/min (ref >60)
Glucose, Bld: 110 mg/dL — ABNORMAL HIGH (ref 70–99)
Potassium: 4 mmol/L (ref 3.5–5.1)
Sodium: 137 mmol/L (ref 135–145)
TOTAL PROTEIN: 7 g/dL (ref 6.5–8.1)
Total Bilirubin: 0.6 mg/dL (ref 0.3–1.2)

## 2018-07-18 LAB — CBC WITH DIFFERENTIAL/PLATELET
Abs Immature Granulocytes: 0.01 10*3/uL (ref 0.00–0.07)
Basophils Absolute: 0 10*3/uL (ref 0.0–0.1)
Basophils Relative: 1 %
Eosinophils Absolute: 0.2 10*3/uL (ref 0.0–0.5)
Eosinophils Relative: 3 %
HEMATOCRIT: 36.3 % — AB (ref 39.0–52.0)
Hemoglobin: 11.9 g/dL — ABNORMAL LOW (ref 13.0–17.0)
Immature Granulocytes: 0 %
LYMPHS ABS: 2.5 10*3/uL (ref 0.7–4.0)
Lymphocytes Relative: 42 %
MCH: 30.4 pg (ref 26.0–34.0)
MCHC: 32.8 g/dL (ref 30.0–36.0)
MCV: 92.8 fL (ref 80.0–100.0)
Monocytes Absolute: 0.6 10*3/uL (ref 0.1–1.0)
Monocytes Relative: 10 %
NEUTROS PCT: 44 %
Neutro Abs: 2.6 10*3/uL (ref 1.7–7.7)
Platelets: 252 10*3/uL (ref 150–400)
RBC: 3.91 MIL/uL — ABNORMAL LOW (ref 4.22–5.81)
RDW: 12.6 % (ref 11.5–15.5)
WBC: 6 10*3/uL (ref 4.0–10.5)
nRBC: 0 % (ref 0.0–0.2)

## 2018-07-18 MED ORDER — HYDROCODONE-ACETAMINOPHEN 5-325 MG PO TABS: 1.0000 | ORAL_TABLET | ORAL | 0 refills | Status: AC | PRN

## 2018-07-18 NOTE — Discharge Instructions (Signed)
Your work-up today shows evidence of a disc bulge in your lumbar spine likely contributing to the shooting pain going down your right leg with numbness and weakness.  As we discussed, we feel it is necessary for you to get an MRI today to rule out nerve injury however, after our shared decision-making conversation, you elected to forego further imaging at this time.  As you report you have tolerated hydrocodone in the past, will give a prescription for pain medication.  Please use this to help with your discomfort.  Please follow-up with your previous spine surgery team in the next 24 to 40 hours.  He will likely need MRI.  If any symptoms change or worsen including loss of bowel or bladder control, further numbness or weakness, or new symptoms, please return to the nearest emergency department immediately.

## 2018-07-18 NOTE — ED Triage Notes (Signed)
Pt here with lower right back pain shooting down right leg and into foot.

## 2018-07-18 NOTE — ED Provider Notes (Signed)
[facility] EMERGENCY DEPARTMENT Provider Note   CSN: [id number] Arrival date & time: 07/18/18  1018     History   Chief Complaint Chief Complaint  Patient presents with  . Back Pain    HPI Jeffery Castillo is a 83 y.o. male.  The history is provided by the patient and medical records. No language interpreter was used.  Back Pain  Location:  Lumbar spine Quality:  Shooting and stabbing Radiates to:  R knee, R foot and R posterior upper leg Pain severity:  Severe Pain is:  Same all the time Onset quality:  Sudden Duration:  5 days Timing:  Constant Progression:  Unchanged Chronicity:  New Context: falling (from ladder)   Relieved by:  Nothing Worsened by:  Twisting, standing and palpation Ineffective treatments:  None tried Associated symptoms: leg pain, numbness and weakness   Associated symptoms: no abdominal pain, no bladder incontinence, no bowel incontinence, no chest pain, no dysuria, no fever, no headaches, no paresthesias, no pelvic pain, no perianal numbness and no tingling   Risk factors: hx of cancer     Past Medical History:  Diagnosis Date  . Benign essential HTN 08/09/2011  . Benign essential HTN 08/09/2011  . Cancer ([place])    colon ca dx'd 2003  . Cancer [facility])    prostate ca dx'd 2000  . Cervical disc herniation 08/09/2011  . DJD (degenerative joint disease)   . DM II (diabetes mellitus, type II), controlled ([place]) 08/09/2011   DIET CONTROLLED  . H/O repair of right rotator cuff 10/31/2011  . Hepatomegaly 11/08/2011   No focal deficit on CT   . Nephrolithiasis 08/09/2011  . Pain    RIGHT SHOULDER PAIN AND OA  . Prostate CA ([employer]) 08/09/2011  . Seasonal allergies     Patient Active Problem List   Diagnosis Date Noted  . Hepatomegaly 11/08/2011  . H/O repair of right rotator cuff 10/28/2011  . Prostate CA ([place]) 08/09/2011  . H/O colon cancer, stage III 08/09/2011  . Nephrolithiasis 08/09/2011  . Cervical disc herniation 08/09/2011  .  Benign essential HTN 08/09/2011  . DM II (diabetes mellitus, type II), controlled ([place]) 08/09/2011    Past Surgical History:  Procedure Laterality Date  . ANTERIOR CERVICAL DECOMP/DISCECTOMY FUSION  2010  . APPENDECTOMY    . COLON SURGERY  2003   COLON RESECTION FOR CA  . RTIGHT SHOULDER ARTHROSCPY     APRIL 2013  . seed implalnts for tx of prostate cancer          Home Medications    Prior to Admission medications   Medication Sig Start Date End Date Taking? Authorizing Provider  aspirin EC 81 MG tablet Take 81 mg by mouth daily.   Yes [provider]  fluticasone (FLONASE) 50 MCG/ACT nasal spray Place 1 spray into the nose daily as needed for allergies.  04/26/12  Yes [provider]  losartan (COZAAR) 50 MG tablet Take 50 mg by mouth at bedtime.   Yes [provider]  Multiple Vitamin (MULTIVITAMIN) capsule Take 1 capsule by mouth daily.   Yes [provider]  temazepam (RESTORIL) 15 MG capsule Take 15 mg by mouth at bedtime as needed for sleep.    Yes [provider]    Family History History reviewed. No pertinent family history.  Social History Social History   Tobacco Use  . Smoking status: Former Research scientist (life sciences)  . Smokeless tobacco: Never Used  . Tobacco comment: [place]  Substance Use Topics  . Alcohol use: No  . Drug use: No     Allergies   Patient has no known allergies.   Review of Systems Review of Systems  Constitutional: Negative for chills, diaphoresis and fever.  HENT: Negative for congestion and rhinorrhea.   Eyes: Negative for visual disturbance.  Respiratory: Negative for cough, chest tightness, shortness of breath and wheezing.   Cardiovascular: Negative for chest pain, palpitations and leg swelling.  Gastrointestinal: Negative for abdominal pain, bowel incontinence, constipation, diarrhea, nausea and vomiting.  Genitourinary: Negative for bladder incontinence, dysuria, flank pain and pelvic  pain.  Musculoskeletal: Positive for back pain. Negative for neck pain and neck stiffness.  Skin: Negative for rash and wound.  Neurological: Positive for weakness and numbness. Negative for tingling, light-headedness, headaches and paresthesias.  Psychiatric/Behavioral: Negative for agitation.  All other systems reviewed and are negative.    Physical Exam Updated Vital Signs BP 133/79 (BP Location: Right Arm)   Pulse (!) 52   Temp (!) 97.5 F (36.4 C) (Oral)   Resp 14   Ht 5\' 9"  (1.753 m)   Wt 95.7 kg   SpO2 100%   BMI 31.16 kg/m   Physical Exam Vitals signs and nursing note reviewed.  Constitutional:      General: He is not in acute distress.    Appearance: He is well-developed. He is not ill-appearing, toxic-appearing or diaphoretic.  HENT:     Head: Normocephalic and atraumatic.     Nose: No congestion or rhinorrhea.     Mouth/Throat:     Pharynx: No oropharyngeal exudate or posterior oropharyngeal erythema.  Eyes:     Conjunctiva/sclera: Conjunctivae normal.  Neck:     Musculoskeletal: Neck supple.  Cardiovascular:     Rate and Rhythm: Normal rate and regular rhythm.     Heart sounds: No murmur.  Pulmonary:     Effort: Pulmonary effort is normal. No respiratory distress.     Breath sounds: Normal breath sounds. No wheezing, rhonchi or rales.  Chest:     Chest wall: No tenderness.  Abdominal:     Palpations: Abdomen is soft.     Tenderness: There is no abdominal tenderness. There is no right CVA tenderness, left CVA tenderness, guarding or rebound.  Musculoskeletal:        General: Tenderness and signs of injury present. No deformity.     Lumbar back: He exhibits tenderness and pain.       Back:     Right lower leg: No edema.     Left lower leg: No edema.     Comments: Patient has tenderness in his lumbar spine.  No significant midline tenderness.  No laceration seen.  No significant muscle spasm.  Patient has no tenderness in the right knee or right ankle.   No other injury seen on exam.  Patient has subjective numbness in his right leg compared to left.  Symmetric pulses.  Patient has mild weakness with right leg raise compared to left.  Skin:    General: Skin is warm and dry.     Capillary Refill: Capillary refill takes less than 2 seconds.     Findings: No erythema or rash.  Neurological:     General: No focal deficit present.     Mental Status: He is alert and oriented to person, place, and time.     Sensory: Sensory deficit present.     Motor: Weakness present.  Psychiatric:        Mood  and Affect: Mood normal.      ED Treatments / Results  Labs (all labs ordered are listed, but only abnormal results are displayed) Labs Reviewed  CBC WITH DIFFERENTIAL/PLATELET - Abnormal; Notable for the following components:      Result Value   RBC 3.91 (*)    Hemoglobin 11.9 (*)    HCT 36.3 (*)    All other components within normal limits  COMPREHENSIVE METABOLIC PANEL - Abnormal; Notable for the following components:   Glucose, Bld 110 (*)    Calcium 8.7 (*)    All other components within normal limits    EKG None  Radiology Ct Lumbar Spine Wo Contrast  Result Date: 07/18/2018 CLINICAL DATA:  Right low back pain extending into the right lower extremity. Patient fell from ladder 1 week ago. Initial encounter. EXAM: CT LUMBAR SPINE WITHOUT CONTRAST TECHNIQUE: Multidetector CT imaging of the lumbar spine was performed without intravenous contrast administration. Multiplanar CT image reconstructions were also generated. COMPARISON:  CT of the abdomen pelvis 02/02/2011 FINDINGS: Segmentation: 5 non rib-bearing lumbar type vertebral bodies are present. The lowest fully formed vertebral body is L5. Alignment: AP alignment is anatomic. Slight rightward curvature is stable. Vertebrae: Vertebral body heights are maintained. No acute or healing fractures are present. Paraspinal and other soft tissues: Atherosclerotic calcifications are again noted  in the aorta and branch vessels without aneurysm. Diverticular changes are present in the visualized sigmoid colon without focal inflammation. No solid organ lesions are present. Disc levels: L1-2: Mild disc bulging and bilateral facet hypertrophy is present. The central canal is patent. Mild foraminal narrowing is noted bilaterally. L2-3: A broad-based disc protrusion is present. Moderate facet hypertrophy is present bilaterally. Mild subarticular and foraminal narrowing is present bilaterally. L3-4: A broad-based disc protrusion is present. Moderate facet hypertrophy and ligamentum flavum thickening is present. This leads to moderate central canal stenosis. Moderate foraminal narrowing is worse on the right. L4-5: A broad-based disc protrusion is present. Advanced facet hypertrophy and ligamentum flavum thickening is noted. Moderate central and bilateral foraminal narrowing is present. L5-S1: A vacuum disc is present. A rightward disc protrusion is noted. Facet hypertrophy is worse on the right. Mild right subarticular and moderate right foraminal stenosis is present. The left foramen is patent. IMPRESSION: 1. Asymmetric right-sided disease at L5-S1 with mild right subarticular moderate right foraminal stenosis. 2. Broad-based disc protrusion and moderate facet hypertrophy at L3-4 at L4-5 leads to moderate central and bilateral foraminal narrowing at both levels. 3. Mild subarticular and foraminal narrowing bilaterally at L2-3. 4. Mild foraminal narrowing bilaterally at L1-2 without significant central canal stenosis. Electronically Signed   By: San Morelle M.D.   On: 07/18/2018 13:12   Dg Hip Unilat W Or Wo Pelvis 2-3 Views Right  Result Date: 07/18/2018 CLINICAL DATA:  Fall from ladder.  Right hip pain. EXAM: DG HIP (WITH OR WITHOUT PELVIS) 2-3V RIGHT COMPARISON:  None. FINDINGS: Right hip is located. No acute or healing fracture is present. Pelvis is intact. Prostate seeds are noted. IMPRESSION:  Acute abnormality. Electronically Signed   By: San Morelle M.D.   On: 07/18/2018 13:23    Procedures Procedures (including critical care time)  Medications Ordered in ED Medications - No data to display   Initial Impression / Assessment and Plan / ED Course  I have reviewed the triage vital signs and the nursing notes.  Pertinent labs & imaging results that were available during my care of the patient were reviewed by me and  considered in my medical decision making (see chart for details).     ARISTOTELIS VILARDI is a 83 y.o. male with a past medical history significant for hypertension, diabetes, prostate cancer, prior colon cancer, and prior cervical spine surgery who presents with a fall and low back pain.  Patient reports that he was on a ladder looking at a roof when he fell approximately 5 days ago.  He reports that he fell between 5 and 6 feet hitting the ground.  He reports mild pain at onset that has gradually worsened.  He reports the pain is in his right low back primarily.  No history of chronic low back pains.  He reports that he is not having any incontinence of bowel or bladder but he is having right leg numbness and weakness.  He reports is very painful to move his right leg in his low back.  He denies any headache, neck pain, neck stiffness.  No loss of consciousness vision changes nausea vomiting, urinary symptoms or GI symptoms.  He is only reporting 11 out of 10 back pain when he tries to ambulate or move his right leg.   Of note, patient does have red flags of prior prostate cancer and colon cancer which might lead to pathologic fracture.  On exam, patient was straight leg raise positive on the right.  Patient had tenderness in the right lateral low back.  Patient had decrease in station in the right leg and decrease strength with her leg raise.  Normal pulses.  No laceration seen.  Abdomen mid back and upper back nontender.  Chest nontender.  Lungs  clear.  Clinically I am concerned about traumatic injury to his lumbar spine.  Patient will have CT of the lumbar spine as well as x-ray of the right hip and pelvis.  Patient did not want pain medicine at this time but he agreed to screening blood work in the event he needs further imaging or procedures performed.  Anticipate reassessment after CT and x-ray.  Due to the numbness and weakness, patient may require MRI not available at this facility, will discuss this after imaging.  2:15 PM X-ray of the pelvis and hip showed no acute fracture or dislocation.  CT scan showed lumbar degenerative disease as well as a broad-based disc bulge.  Suspect this is the cause of his new symptoms.  Shared decision made conversation held with patient and we advised that he needs an MRI of the lumbar spine today due to the new numbness and weakness in his leg after a fall and lumbar trauma.  Patient says he does not want further imaging today as he does not have time.  He reports that he will call his back doctor and get seen in the next several days to get MRI and further work-up.  He understands that if he develops any new numbness or weakness or he has incontinence of bladder or bowel he needs to return to the nearest emergency department daily for MRI to rule out cauda equina.   As the patient clearly understands the risks of this including further nerve damage and ultimately could lead to loss of use of the limb, patient still wants to go home at this time.  He reports that he has tolerated hydrocodone in the past and we will give him a prescription for pain medication.  He has no other questions or concerns and understands risk of discharge.  Patient discharged in stable condition.   Final Clinical Impressions(s) /  ED Diagnoses   Final diagnoses:  Fall from ladder, initial encounter  Acute right-sided low back pain with right-sided sciatica  Right leg weakness  Right leg numbness    ED Discharge  Orders         Ordered    HYDROcodone-acetaminophen (NORCO/VICODIN) 5-325 MG tablet  Every 4 hours PRN     07/18/18 1410          Clinical Impression: 1. Fall from ladder, initial encounter   2. Acute right-sided low back pain with right-sided sciatica   3. Right leg weakness   4. Right leg numbness     Disposition: Discharge  Condition: Good  I have discussed the results, Dx and Tx plan with the pt(& family if present). He/she/they expressed understanding and agree(s) with the plan. Discharge instructions discussed at great length. Strict return precautions discussed and pt &/or family have verbalized understanding of the instructions. No further questions at time of discharge.    New Prescriptions   HYDROCODONE-ACETAMINOPHEN (NORCO/VICODIN) 5-325 MG TABLET    Take 1 tablet by mouth every 4 (four) hours as needed.    Follow Up: Prince Solian, MD [place] [address] [phone]     Eleonore Chiquito, NP [address] [place] [address] [phone]   with neurosurgery if your back doctor is unavailable.  [facility] [address] [id number] [place] [phone]    Your spine surgeon        Teresina Bugaj, Gwenyth Allegra, MD 07/18/18 1426

## 2018-07-23 DIAGNOSIS — H26491 Other secondary cataract, right eye: Secondary | ICD-10-CM | POA: Diagnosis not present

## 2018-07-25 DIAGNOSIS — H6063 Unspecified chronic otitis externa, bilateral: Secondary | ICD-10-CM | POA: Diagnosis not present

## 2018-07-25 DIAGNOSIS — H903 Sensorineural hearing loss, bilateral: Secondary | ICD-10-CM | POA: Diagnosis not present

## 2018-09-11 DIAGNOSIS — E038 Other specified hypothyroidism: Secondary | ICD-10-CM | POA: Diagnosis not present

## 2018-09-11 DIAGNOSIS — E1165 Type 2 diabetes mellitus with hyperglycemia: Secondary | ICD-10-CM | POA: Diagnosis not present

## 2018-09-11 DIAGNOSIS — Z125 Encounter for screening for malignant neoplasm of prostate: Secondary | ICD-10-CM | POA: Diagnosis not present

## 2018-09-11 DIAGNOSIS — E7849 Other hyperlipidemia: Secondary | ICD-10-CM | POA: Diagnosis not present

## 2018-09-20 DIAGNOSIS — E785 Hyperlipidemia, unspecified: Secondary | ICD-10-CM | POA: Diagnosis not present

## 2018-09-20 DIAGNOSIS — G453 Amaurosis fugax: Secondary | ICD-10-CM | POA: Diagnosis not present

## 2018-09-20 DIAGNOSIS — M5137 Other intervertebral disc degeneration, lumbosacral region: Secondary | ICD-10-CM | POA: Diagnosis not present

## 2018-09-20 DIAGNOSIS — E039 Hypothyroidism, unspecified: Secondary | ICD-10-CM | POA: Diagnosis not present

## 2018-09-20 DIAGNOSIS — Z0000 Encounter for general adult medical examination without abnormal findings: Secondary | ICD-10-CM | POA: Diagnosis not present

## 2018-09-20 DIAGNOSIS — C61 Malignant neoplasm of prostate: Secondary | ICD-10-CM | POA: Diagnosis not present

## 2018-09-20 DIAGNOSIS — C189 Malignant neoplasm of colon, unspecified: Secondary | ICD-10-CM | POA: Diagnosis not present

## 2018-09-20 DIAGNOSIS — E119 Type 2 diabetes mellitus without complications: Secondary | ICD-10-CM | POA: Diagnosis not present

## 2018-09-20 DIAGNOSIS — I10 Essential (primary) hypertension: Secondary | ICD-10-CM | POA: Diagnosis not present

## 2018-09-20 DIAGNOSIS — J449 Chronic obstructive pulmonary disease, unspecified: Secondary | ICD-10-CM | POA: Diagnosis not present

## 2019-02-07 DIAGNOSIS — R69 Illness, unspecified: Secondary | ICD-10-CM | POA: Diagnosis not present

## 2019-03-22 DIAGNOSIS — I10 Essential (primary) hypertension: Secondary | ICD-10-CM | POA: Diagnosis not present

## 2019-03-22 DIAGNOSIS — E119 Type 2 diabetes mellitus without complications: Secondary | ICD-10-CM | POA: Diagnosis not present

## 2019-03-22 DIAGNOSIS — E785 Hyperlipidemia, unspecified: Secondary | ICD-10-CM | POA: Diagnosis not present

## 2019-03-22 DIAGNOSIS — C61 Malignant neoplasm of prostate: Secondary | ICD-10-CM | POA: Diagnosis not present

## 2019-03-22 DIAGNOSIS — J449 Chronic obstructive pulmonary disease, unspecified: Secondary | ICD-10-CM | POA: Diagnosis not present

## 2019-03-22 DIAGNOSIS — M5137 Other intervertebral disc degeneration, lumbosacral region: Secondary | ICD-10-CM | POA: Diagnosis not present

## 2019-03-22 DIAGNOSIS — G453 Amaurosis fugax: Secondary | ICD-10-CM | POA: Diagnosis not present

## 2019-03-22 DIAGNOSIS — M5030 Other cervical disc degeneration, unspecified cervical region: Secondary | ICD-10-CM | POA: Diagnosis not present

## 2019-03-25 DIAGNOSIS — E119 Type 2 diabetes mellitus without complications: Secondary | ICD-10-CM | POA: Diagnosis not present

## 2019-03-25 DIAGNOSIS — Z125 Encounter for screening for malignant neoplasm of prostate: Secondary | ICD-10-CM | POA: Diagnosis not present

## 2019-03-25 DIAGNOSIS — I10 Essential (primary) hypertension: Secondary | ICD-10-CM | POA: Diagnosis not present

## 2019-06-07 DIAGNOSIS — M65311 Trigger thumb, right thumb: Secondary | ICD-10-CM | POA: Diagnosis not present

## 2019-06-11 DIAGNOSIS — M19041 Primary osteoarthritis, right hand: Secondary | ICD-10-CM | POA: Diagnosis not present

## 2019-06-25 DIAGNOSIS — E119 Type 2 diabetes mellitus without complications: Secondary | ICD-10-CM | POA: Diagnosis not present

## 2019-06-25 DIAGNOSIS — J449 Chronic obstructive pulmonary disease, unspecified: Secondary | ICD-10-CM | POA: Diagnosis not present

## 2019-06-25 DIAGNOSIS — U071 COVID-19: Secondary | ICD-10-CM | POA: Diagnosis not present

## 2019-07-12 ENCOUNTER — Encounter: Payer: Self-pay | Admitting: Pulmonary Disease

## 2019-07-12 ENCOUNTER — Telehealth: Payer: Self-pay | Admitting: Pulmonary Disease

## 2019-07-12 NOTE — Telephone Encounter (Signed)
07/12/2019 1544  I was able to successfully reach the patient.  The patient reviewed his demographic information with me and then asked for me to speak to his wife.  While talking to his wife as well as the patient himself they reported the patient symptoms started on 06/22/2019.  He tested positive at primary care 06/25/2019.  He also had a chest x-ray performed at that time which per wife was normal.  I am unable to view the testing results as well as the x-ray images at this time.  Patient contacted primary care this week reporting the symptoms were not getting better.  Spouse is concerned that he is not eating well or hydrating well.  PCP referred patient to the monoclonal antibody infusion hotline.  Unfortunately, this patient no longer qualifies for the outpatient monoclonal antibody infusion due to the fact that he is outside the 10-day window.  Also if symptoms or not improving and he is declining this would also make it more difficult for [place] to give him this infusion.  I have reviewed this with the patient as well as the spouse.  They understand.  I reviewed his current symptoms.  Emphasized that if symptoms or not getting better that he seek emergent evaluation or present to an urgent care.  Spouse is being seen by primary care on Monday.  Informed spouse to contact primary care and coordinate a follow-up appointment at the same time for the patient.  She reports that she will do that.  Wyn Quaker, FNP

## 2019-07-15 DIAGNOSIS — I499 Cardiac arrhythmia, unspecified: Secondary | ICD-10-CM | POA: Diagnosis not present

## 2019-07-15 DIAGNOSIS — R404 Transient alteration of awareness: Secondary | ICD-10-CM | POA: Diagnosis not present

## 2019-07-22 DIAGNOSIS — 419620001 Death: Secondary | SNOMED CT | POA: Diagnosis not present

## 2019-07-22 DEATH — deceased
